# Patient Record
Sex: Female | Born: 1965 | Race: White | Hispanic: No | Marital: Married | Smoking: Never smoker
Health system: Southern US, Community
[De-identification: ages and names within clinical notes are randomized; demographics above are authoritative.]

## PROBLEM LIST (undated history)

## (undated) DIAGNOSIS — I10 Essential (primary) hypertension: Secondary | ICD-10-CM

## (undated) DIAGNOSIS — E7800 Pure hypercholesterolemia, unspecified: Secondary | ICD-10-CM

---

## 1995-01-29 HISTORY — PX: ECTOPIC PREGNANCY SURGERY: SHX613

## 2016-04-15 ENCOUNTER — Other Ambulatory Visit: Payer: Self-pay | Admitting: Family Medicine

## 2016-04-15 DIAGNOSIS — Z1231 Encounter for screening mammogram for malignant neoplasm of breast: Secondary | ICD-10-CM

## 2016-04-18 ENCOUNTER — Ambulatory Visit
Admission: RE | Admit: 2016-04-18 | Discharge: 2016-04-18 | Disposition: A | Discharge status: Home / Self Care | Payer: BC Managed Care – PPO | Source: Ambulatory Visit | Attending: Family Medicine | Admitting: Family Medicine

## 2016-04-18 DIAGNOSIS — Z1231 Encounter for screening mammogram for malignant neoplasm of breast: Secondary | ICD-10-CM

## 2017-01-17 ENCOUNTER — Other Ambulatory Visit: Payer: Self-pay | Admitting: Gastroenterology

## 2017-01-17 DIAGNOSIS — R112 Nausea with vomiting, unspecified: Secondary | ICD-10-CM

## 2017-01-23 ENCOUNTER — Ambulatory Visit
Admission: RE | Admit: 2017-01-23 | Discharge: 2017-01-23 | Disposition: A | Discharge status: Home / Self Care | Payer: BC Managed Care – PPO | Source: Ambulatory Visit | Attending: Gastroenterology | Admitting: Gastroenterology

## 2017-01-23 DIAGNOSIS — R112 Nausea with vomiting, unspecified: Secondary | ICD-10-CM

## 2017-01-24 ENCOUNTER — Other Ambulatory Visit (HOSPITAL_COMMUNITY): Payer: Self-pay | Admitting: Gastroenterology

## 2017-01-24 DIAGNOSIS — R112 Nausea with vomiting, unspecified: Secondary | ICD-10-CM

## 2017-01-29 ENCOUNTER — Ambulatory Visit (HOSPITAL_COMMUNITY)
Admission: RE | Admit: 2017-01-29 | Discharge: 2017-01-29 | Disposition: A | Discharge status: Home / Self Care | Payer: BC Managed Care – PPO | Source: Ambulatory Visit | Attending: Gastroenterology | Admitting: Gastroenterology

## 2017-02-03 ENCOUNTER — Other Ambulatory Visit: Payer: Self-pay | Admitting: Gastroenterology

## 2017-02-03 DIAGNOSIS — N281 Cyst of kidney, acquired: Secondary | ICD-10-CM

## 2017-02-06 ENCOUNTER — Encounter (HOSPITAL_COMMUNITY): Payer: Self-pay

## 2017-02-06 ENCOUNTER — Ambulatory Visit (HOSPITAL_COMMUNITY): Payer: BC Managed Care – PPO

## 2017-04-16 ENCOUNTER — Other Ambulatory Visit: Payer: Self-pay | Admitting: Family Medicine

## 2017-04-16 DIAGNOSIS — Z1231 Encounter for screening mammogram for malignant neoplasm of breast: Secondary | ICD-10-CM

## 2017-05-05 ENCOUNTER — Ambulatory Visit: Payer: BC Managed Care – PPO

## 2017-05-29 ENCOUNTER — Ambulatory Visit
Admission: RE | Admit: 2017-05-29 | Discharge: 2017-05-29 | Disposition: A | Discharge status: Home / Self Care | Payer: BC Managed Care – PPO | Source: Ambulatory Visit | Attending: Family Medicine | Admitting: Family Medicine

## 2017-05-29 DIAGNOSIS — Z1231 Encounter for screening mammogram for malignant neoplasm of breast: Secondary | ICD-10-CM

## 2018-06-10 ENCOUNTER — Encounter: Payer: Self-pay | Admitting: Internal Medicine

## 2018-06-10 ENCOUNTER — Other Ambulatory Visit: Payer: Self-pay

## 2018-06-10 ENCOUNTER — Ambulatory Visit: Payer: BC Managed Care – PPO | Admitting: Internal Medicine

## 2018-06-10 ENCOUNTER — Ambulatory Visit (INDEPENDENT_AMBULATORY_CARE_PROVIDER_SITE_OTHER): Payer: BC Managed Care – PPO

## 2018-06-10 DIAGNOSIS — J45991 Cough variant asthma: Secondary | ICD-10-CM

## 2018-06-10 MED ORDER — BUDESONIDE-FORMOTEROL FUMARATE 80-4.5 MCG/ACT IN AERO: 2.0000 | INHALATION_SPRAY | Freq: Two times a day (BID) | RESPIRATORY_TRACT | 0 refills | Status: AC

## 2018-06-10 NOTE — Assessment & Plan Note (Addendum)
Onset in Middle school  -  06/10/2018  After extensive coaching inhaler device,  effectiveness =    75%  rec symb 80 2bid prn   Hx is most c/w intermittent, not chronic cough variant asthma and best rx is probably "prn" symbicort 80 Based on two studies from NEJM  378; 20 p 1865 (2018) and 380 : p2020-30 (2019) in pts with mild asthma it is reasonable to use low dose symbicort eg 80 2bid "prn" flare in this setting but I emphasized this was only shown with symbicort and takes advantage of the rapid onset of action but is not the same as "rescue therapy" but can be stopped once the acute symptoms have resolved and the need for rescue has been minimized (< 2 x weekly)  . Advises that if she does take symbicort 80 prn I strongly advise she take the 2 bid dose to start with for a week to feel the full benefit of both the LABA ( 5 min onset) and the ICS (one week benefit).   Advised: If your breathing worsens or you need to use your rescue inhaler more than twice weekly or wake up more than twice a month with any respiratory symptoms or require more than two rescue inhalers per year, we need to see you right away because this means we're not controlling the underlying problem (inflammation) adequately.  Rescue inhalers (albuterol) do not control inflammation and overuse can lead to unnecessary and costly consequences.  They can make you feel better temporarily but eventually they will quit working effectively much as sleep aids lead to more insomnia if used regularly.     F/u in 6 weeks.    Total time devoted to counseling  > 50 % of initial 60 min office visit:  review case with pt/   device teaching which extended face to face time for this visit/  discussion of options/alternatives/ personally creating written customized instructions  in presence of pt  then going over those specific  Instructions directly with the pt including how to use all of the meds but in particular covering each new medication in  detail and the difference between the maintenance= "automatic" meds and the prns using an action plan format for the latter (If this problem/symptom => do that organization reading Left to right).  Please see AVS from this visit for a full list of these instructions which I personally wrote for this pt and  are unique to this visit.

## 2018-06-10 NOTE — Patient Instructions (Addendum)
Plan A = Automatic = Symbicort 80 Take up to 2 puffs first thing in am and then another 2 puffs about 12 hours later.   Work on inhaler technique:  relax and gently blow all the way out then take a nice smooth deep breath back in, triggering the inhaler at same time you start breathing in.  Hold for up to 5 seconds if you can. Blow out thru nose. Rinse and gargle with water when done    Plan B = Backup Only use your albuterol inhaler as a rescue medication to be used if you can't catch your breath by resting or doing a relaxed purse lip breathing pattern.  - The less you use it, the better it will work when you need it. - Ok to use the inhaler up to 2 puffs  every 4 hours if you must but call for appointment if use goes up over your usual need - Don't leave home without it !!  (think of it like the spare tire for your car)   Please remember to go to the  x-ray department  for your tests - we will call you with the results when they are available    Please schedule a follow up office visit in 6 weeks, call sooner if needed with all medications /inhalers/ solutions in hand so we can verify exactly what you are taking. This includes all medications from all doctors and over the counters

## 2018-06-10 NOTE — Progress Notes (Signed)
Cheryl Schwartz, female    DOB: Jun 10, 1965     MRN: [id number]   Brief patient profile:  7 yow never smoker with recurrent cough pattern in middle school while living in [facility] [place]   Then lived in [place] about the same pattern then moved to [place] and did fine s any meds until late March 2020 with onset cough and chest tightness > UC neb rx helped only the wheeze but not the chest tightness or the coughing and then placed on symbcort "no  better " > then took prednisone and totally eliminated all the symptoms so referred to pulmonary clinic 06/10/2018 by Jarrett Soho       History of Present Illness  06/10/2018  Pulmonary/ 1st office eval/Cheryl Schwartz last prednisone one day prior / no longer taking symbicort  Chief Complaint  Patient presents with  . Pulmonary Consult    Referred by Jarrett Soho, PA. Pt states she had been having chest tightness and cough- resolved since took prednisone recently.   Dyspnea:  Walking dog / uphillls ok but real aerobics  Cough: gone / was dry and daytime not typically noct  Sleep: fine flat/ one pillows  SABA use: not needing   No obvious day to day or daytime variability or assoc excess/ purulent sputum or mucus plugs or hemoptysis or cp or chest tightness, subjective wheeze or overt sinus or hb symptoms.   Sleeping  without nocturnal  or early am exacerbation  of respiratory  c/o's or need for noct saba. Also denies any obvious fluctuation of symptoms with weather or environmental changes or other aggravating or alleviating factors except as outlined above   No unusual exposure hx or h/o childhood pna  or knowledge of premature birth.  Current Allergies, Complete Past Medical History, Past Surgical History, Family History, and Social History were reviewed in [employer] record.  ROS  The following are not active complaints unless bolded Hoarseness, sore throat, dysphagia, dental problems, itching, sneezing,  nasal  congestion or discharge of excess mucus or purulent secretions, ear ache,   fever, chills, sweats, unintended wt loss or wt gain, classically pleuritic or exertional cp,  orthopnea pnd or arm/hand swelling  or leg swelling, presyncope, palpitations, abdominal pain, anorexia, nausea, vomiting, diarrhea  or change in bowel habits or change in bladder habits, change in stools or change in urine, dysuria, hematuria,  rash, arthralgias, visual complaints, headache, numbness, weakness or ataxia or problems with walking or coordination,  change in mood or  memory.           No past medical history on file.  Outpatient Medications Prior to Visit  Medication Sig Dispense Refill  . chlorthalidone (HYGROTON) 25 MG tablet Take 1 tablet by mouth daily.    . pravastatin (PRAVACHOL) 40 MG tablet Take 40 mg by mouth daily.    . verapamil (CALAN-SR) 240 MG CR tablet Take 1 tablet by mouth daily.    Marland Kitchen albuterol (VENTOLIN HFA) 108 (90 Base) MCG/ACT inhaler Inhale 2 puffs into the lungs every 4 (four) hours as needed.    Marland Kitchen buPROPion (WELLBUTRIN XL) 150 MG 24 hr tablet Take 1 tablet by mouth daily.        Objective:     BP 136/90 (BP Location: Left Arm, Cuff Size: Normal)   Pulse 70   Temp 98.7 F (37.1 C) (Oral)   Ht  (1.778 m)   Wt 218[name] kg)   SpO2 98%   BMI 31.28  kg/m   SpO2: 98 % RA  Pleasant amb wf nad  HEENT: nl dentition, turbinates bilaterally, and oropharynx. Nl external ear canals without cough reflex   NECK :  without JVD/Nodes/TM/ nl carotid upstrokes bilaterally   LUNGS: no acc muscle use,  Nl contour chest which is clear to A and P bilaterally without cough on insp or exp maneuvers   CV:  RRR  no s3 or murmur or increase in P2, and no edema   ABD:  soft and nontender with nl inspiratory excursion in the supine position. No bruits or organomegaly appreciated, bowel sounds nl  MS:  Nl gait/ ext warm without deformities, calf tenderness, cyanosis or clubbing No  obvious joint restrictions   SKIN: warm and dry without lesions    NEURO:  alert, approp, nl sensorium with  no motor or cerebellar deficits apparent.    CXR PA and Lateral:   06/10/2018 :    I personally reviewed images and agree with radiology impression as follows:    Negative for acute cardiopulmonary disease      Assessment   Cough variant asthma Onset in Middle school  -  06/10/2018  After extensive coaching inhaler device,  effectiveness =    75%  rec symb 80 2bid prn   Hx is most c/w intermittent, not chronic cough variant asthma and best rx is probably "prn" symbicort 80 Based on two studies from NEJM  378; 20 p 1865 (2018) and 380 : p2020-30 (2019) in pts with mild asthma it is reasonable to use low dose symbicort eg 80 2bid "prn" flare in this setting but I emphasized this was only shown with symbicort and takes advantage of the rapid onset of action but is not the same as "rescue therapy" but can be stopped once the acute symptoms have resolved and the need for rescue has been minimized (< 2 x weekly)  . Advises that if she does take symbicort 80 prn I strongly advise she take the 2 bid dose to start with for a week to feel the full benefit of both the LABA ( 5 min onset) and the ICS (one week benefit).   Advised: If your breathing worsens or you need to use your rescue inhaler more than twice weekly or wake up more than twice a month with any respiratory symptoms or require more than two rescue inhalers per year, we need to see you right away because this means we're not controlling the underlying problem (inflammation) adequately.  Rescue inhalers (albuterol) do not control inflammation and overuse can lead to unnecessary and costly consequences.  They can make you feel better temporarily but eventually they will quit working effectively much as sleep aids lead to more insomnia if used regularly.     F/u in 6 weeks.      Total time devoted to counseling  > 50 % of initial  60 min office visit:  review case with pt/   device teaching which extended face to face time for this visit/  discussion of options/alternatives/ personally creating written customized instructions  in presence of pt  then going over those specific  Instructions directly with the pt including how to use all of the meds but in particular covering each new medication in detail and the difference between the maintenance= "automatic" meds and the prns using an action plan format for the latter (If this problem/symptom => do that organization reading Left to right).  Please see AVS from this visit for a  full list of these instructions which I personally wrote for this pt and  are unique to this visit.    Sandrea HughsMichael Tyshawn Keel, MD 06/10/2018

## 2018-06-11 NOTE — Progress Notes (Signed)
Spoke with pt and notified of results per Dr. Wert. Pt verbalized understanding and denied any questions. 

## 2018-07-02 ENCOUNTER — Other Ambulatory Visit: Payer: Self-pay | Admitting: Family Medicine

## 2018-07-02 DIAGNOSIS — Z9289 Personal history of other medical treatment: Secondary | ICD-10-CM

## 2018-07-22 ENCOUNTER — Ambulatory Visit: Payer: BC Managed Care – PPO | Admitting: Internal Medicine

## 2018-08-07 ENCOUNTER — Encounter: Payer: Self-pay | Admitting: Internal Medicine

## 2018-08-07 ENCOUNTER — Other Ambulatory Visit: Payer: Self-pay

## 2018-08-07 ENCOUNTER — Ambulatory Visit: Payer: BC Managed Care – PPO | Admitting: Internal Medicine

## 2018-08-07 DIAGNOSIS — J45991 Cough variant asthma: Secondary | ICD-10-CM | POA: Diagnosis not present

## 2018-08-07 NOTE — Patient Instructions (Signed)
Symbicort 80 up to Take 2 puffs first thing in am and then another 2 puffs about 12 hours later.   If any respiratory flares occur, immediately start the high dose for a week then wean down or off   Please schedule a follow up visit in 12  months but call sooner if needed

## 2018-08-07 NOTE — Progress Notes (Signed)
Cheryl Schwartz, female    DOB: 11-20-1965     MRN: [id number]   Brief patient profile:  43 yow never smoker with recurrent cough pattern in middle school while living in [place] lived in [place] about the same pattern then moved to [place] and did fine s any meds until late March 2020 with onset cough and chest tightness > UC neb rx helped only the wheeze but not the chest tightness or the coughing and then placed on symbcort "no  better " > then took prednisone and totally eliminated all the symptoms so referred to pulmonary clinic 06/10/2018 by Marda Stalker       History of Present Illness  06/10/2018  Pulmonary/ 1st office eval/Wert last prednisone one day prior / no longer taking symbicort  Chief Complaint  Patient presents with  . Pulmonary Consult    Referred by Marda Stalker, PA. Pt states she had been having chest tightness and cough- resolved since took prednisone recently.   Dyspnea:  Walking dog / uphillls ok but real aerobics  Cough: gone / was dry and daytime not typically noct  Sleep: fine flat/ one pillows  SABA use: not needing  rec Plan A = Automatic = Symbicort 80 Take up to 2 puffs first thing in am and then another 2 puffs about 12 hours later.  Work on inhaler technique:  Plan B = Backup Only use your albuterol inhaler as a rescue medication    08/07/2018  f/u ov/Wert re: cough variant asthma, intermittent, no symbicort for a week, some need for saba Chief Complaint  Patient presents with  . Follow-up    Breathing is overall doing well. She had some cough last night. She rarely uses the albuterol.   Dyspnea:  Walking dog uphill in heat is about the only thing causes any sob  Cough: no Sleeping: fine able to lie flat  SABA use: minimal  02: no    No obvious day to day or daytime variability or assoc excess/ purulent sputum or mucus plugs or hemoptysis or cp or chest tightness, subjective wheeze or overt sinus or hb symptoms.    Sleeping  without nocturnal  or early am exacerbation  of respiratory  c/o's or need for noct saba. Also denies any obvious fluctuation of symptoms with weather or environmental changes or other aggravating or alleviating factors except as outlined above   No unusual exposure hx or h/o childhood pna/ asthma or knowledge of premature birth.  Current Allergies, Complete Past Medical History, Past Surgical History, Family History, and Social History were reviewed in [employer] record.  ROS  The following are not active complaints unless bolded Hoarseness, sore throat, dysphagia, dental problems, itching, sneezing,  nasal congestion or discharge of excess mucus or purulent secretions, ear ache,   fever, chills, sweats, unintended wt loss or wt gain, classically pleuritic or exertional cp,  orthopnea pnd or arm/hand swelling  or leg swelling, presyncope, palpitations, abdominal pain, anorexia, nausea, vomiting, diarrhea  or change in bowel habits or change in bladder habits, change in stools or change in urine, dysuria, hematuria,  rash, arthralgias, visual complaints, headache, numbness, weakness or ataxia or problems with walking or coordination,  change in mood or  memory.        Current Meds  Medication Sig  . albuterol (VENTOLIN HFA) 108 (90 Base) MCG/ACT inhaler Inhale 2 puffs into the lungs every 4 (four) hours as needed.  . budesonide-formoterol (SYMBICORT) 80-4.5  MCG/ACT inhaler Inhale 2 puffs into the lungs 2 (two) times a day.  Marland Kitchen. buPROPion (WELLBUTRIN XL) 150 MG 24 hr tablet Take 1 tablet by mouth daily.  . chlorthalidone (HYGROTON) 25 MG tablet Take 1 tablet by mouth daily.  . pravastatin (PRAVACHOL) 40 MG tablet Take 40 mg by mouth daily.  . verapamil (CALAN-SR) 240 MG CR tablet Take 1 tablet by mouth daily.           Objective:     amb pleasant wf   Wt Readings from Last 3 Encounters:  08/07/18 219 lb (99.3 kg)  06/10/18 218 lb (98.9 kg)     Vital  signs reviewed - Note on arrival 02 sats  95% on RA    HEENT: nl dentition, turbinates bilaterally, and oropharynx. Nl external ear canals without cough reflex   NECK :  without JVD/Nodes/TM/ nl carotid upstrokes bilaterally   LUNGS: no acc muscle use,  Nl contour chest which is clear to A and P bilaterally without cough on insp or exp maneuvers   CV:  RRR  no s3 or murmur or increase in P2, and no edema   ABD:  soft and nontender with nl inspiratory excursion in the supine position. No bruits or organomegaly appreciated, bowel sounds nl  MS:  Nl gait/ ext warm without deformities, calf tenderness, cyanosis or clubbing No obvious joint restrictions   SKIN: warm and dry without lesions    NEURO:  alert, approp, nl sensorium with  no motor or cerebellar deficits apparent.           Assessment

## 2018-08-08 ENCOUNTER — Encounter: Payer: Self-pay | Admitting: Internal Medicine

## 2018-08-08 NOTE — Assessment & Plan Note (Signed)
Onset in Middle school  -  06/10/2018   rec symb 80 2bid prn - 08/07/2018  After extensive coaching inhaler device,  effectiveness =    95% > continue symbicort 80 bid "prn"    All goals of chronic asthma control met including optimal function and elimination of symptoms with minimal need for rescue therapy.  Contingencies discussed in full including contacting this office immediately if not controlling the symptoms using the rule of two's.     Continue prn symb 80  Based on two studies from [place]; 20 p 1865 (2018) and 380 : p2020-30 (2019) in pts with mild asthma it is reasonable to use low dose symbicort eg 80 2bid "prn" flare in this setting but I emphasized this was only shown with symbicort and takes advantage of the rapid onset of action but is not the same as "rescue therapy" but can be stopped once the acute symptoms have resolved and the need for rescue has been minimized (< 2 x weekly)

## 2018-08-20 ENCOUNTER — Other Ambulatory Visit: Payer: Self-pay

## 2018-08-20 ENCOUNTER — Ambulatory Visit
Admission: RE | Admit: 2018-08-20 | Discharge: 2018-08-20 | Disposition: A | Discharge status: Home / Self Care | Payer: BC Managed Care – PPO | Source: Ambulatory Visit | Attending: Family Medicine | Admitting: Family Medicine

## 2018-08-20 DIAGNOSIS — Z9289 Personal history of other medical treatment: Secondary | ICD-10-CM

## 2018-12-01 ENCOUNTER — Other Ambulatory Visit: Payer: Self-pay | Admitting: Family Medicine

## 2018-12-01 ENCOUNTER — Other Ambulatory Visit (HOSPITAL_COMMUNITY)
Admission: RE | Admit: 2018-12-01 | Discharge: 2018-12-01 | Disposition: A | Discharge status: Home / Self Care | Payer: BC Managed Care – PPO | Source: Ambulatory Visit | Attending: Family Medicine | Admitting: Family Medicine

## 2018-12-01 DIAGNOSIS — Z124 Encounter for screening for malignant neoplasm of cervix: Secondary | ICD-10-CM | POA: Diagnosis not present

## 2018-12-04 LAB — CYTOLOGY - PAP
Adequacy: ABSENT
Comment: NEGATIVE
Diagnosis: NEGATIVE
High risk HPV: NEGATIVE

## 2019-08-09 ENCOUNTER — Ambulatory Visit: Payer: BC Managed Care – PPO | Admitting: Internal Medicine

## 2019-09-28 ENCOUNTER — Other Ambulatory Visit: Payer: Self-pay | Admitting: Family Medicine

## 2019-09-28 DIAGNOSIS — Z1231 Encounter for screening mammogram for malignant neoplasm of breast: Secondary | ICD-10-CM

## 2019-10-14 ENCOUNTER — Other Ambulatory Visit: Payer: Self-pay

## 2019-10-14 ENCOUNTER — Ambulatory Visit
Admission: RE | Admit: 2019-10-14 | Discharge: 2019-10-14 | Disposition: A | Discharge status: Home / Self Care | Payer: BC Managed Care – PPO | Source: Ambulatory Visit | Attending: Family Medicine | Admitting: Family Medicine

## 2019-10-14 DIAGNOSIS — Z1231 Encounter for screening mammogram for malignant neoplasm of breast: Secondary | ICD-10-CM

## 2019-10-18 ENCOUNTER — Other Ambulatory Visit: Payer: Self-pay | Admitting: Family Medicine

## 2019-10-18 DIAGNOSIS — R928 Other abnormal and inconclusive findings on diagnostic imaging of breast: Secondary | ICD-10-CM

## 2019-10-29 ENCOUNTER — Ambulatory Visit
Admission: RE | Admit: 2019-10-29 | Discharge: 2019-10-29 | Disposition: A | Discharge status: Home / Self Care | Payer: BC Managed Care – PPO | Source: Ambulatory Visit | Attending: Family Medicine | Admitting: Family Medicine

## 2019-10-29 ENCOUNTER — Other Ambulatory Visit: Payer: Self-pay

## 2019-10-29 DIAGNOSIS — R928 Other abnormal and inconclusive findings on diagnostic imaging of breast: Secondary | ICD-10-CM

## 2020-02-14 ENCOUNTER — Encounter (HOSPITAL_COMMUNITY): Payer: Self-pay | Admitting: *Deleted

## 2020-02-14 ENCOUNTER — Ambulatory Visit (HOSPITAL_COMMUNITY)
Admission: EM | Admit: 2020-02-14 | Discharge: 2020-02-14 | Disposition: A | Discharge status: Home / Self Care | Payer: BC Managed Care – PPO | Attending: Emergency Medicine | Admitting: Emergency Medicine

## 2020-02-14 ENCOUNTER — Emergency Department (HOSPITAL_COMMUNITY)
Admission: EM | Admit: 2020-02-14 | Discharge: 2020-02-14 | Disposition: A | Discharge status: Home / Self Care | Payer: BC Managed Care – PPO | Attending: Emergency Medicine | Admitting: Emergency Medicine

## 2020-02-14 ENCOUNTER — Emergency Department (HOSPITAL_COMMUNITY): Payer: BC Managed Care – PPO

## 2020-02-14 ENCOUNTER — Other Ambulatory Visit: Payer: Self-pay

## 2020-02-14 ENCOUNTER — Encounter (HOSPITAL_COMMUNITY): Payer: Self-pay

## 2020-02-14 DIAGNOSIS — J45991 Cough variant asthma: Secondary | ICD-10-CM | POA: Insufficient documentation

## 2020-02-14 DIAGNOSIS — I10 Essential (primary) hypertension: Secondary | ICD-10-CM | POA: Diagnosis not present

## 2020-02-14 DIAGNOSIS — R079 Chest pain, unspecified: Secondary | ICD-10-CM | POA: Diagnosis not present

## 2020-02-14 DIAGNOSIS — Z79899 Other long term (current) drug therapy: Secondary | ICD-10-CM | POA: Insufficient documentation

## 2020-02-14 HISTORY — DX: Pure hypercholesterolemia, unspecified: E78.00

## 2020-02-14 HISTORY — DX: Essential (primary) hypertension: I10

## 2020-02-14 LAB — CBC
HCT: 38.1 % (ref 36.0–46.0)
Hemoglobin: 12.2 g/dL (ref 12.0–15.0)
MCH: 28.9 pg (ref 26.0–34.0)
MCHC: 32 g/dL (ref 30.0–36.0)
MCV: 90.3 fL (ref 80.0–100.0)
Platelets: 236 10*3/uL (ref 150–400)
RBC: 4.22 MIL/uL (ref 3.87–5.11)
RDW: 12.4 % (ref 11.5–15.5)
WBC: 5.2 10*3/uL (ref 4.0–10.5)
nRBC: 0 % (ref 0.0–0.2)

## 2020-02-14 LAB — BASIC METABOLIC PANEL
Anion gap: 13 (ref 5–15)
BUN: 10 mg/dL (ref 6–20)
CO2: 25 mmol/L (ref 22–32)
Calcium: 9.8 mg/dL (ref 8.9–10.3)
Chloride: 101 mmol/L (ref 98–111)
Creatinine, Ser: 0.81 mg/dL (ref 0.44–1.00)
GFR, Estimated: >60 mL/min (ref >60)
Glucose, Bld: 93 mg/dL (ref 70–99)
Potassium: 3.8 mmol/L (ref 3.5–5.1)
Sodium: 139 mmol/L (ref 135–145)

## 2020-02-14 LAB — TROPONIN I (HIGH SENSITIVITY)
Troponin I (High Sensitivity): 3 ng/L (ref <18)
Troponin I (High Sensitivity): 3 ng/L (ref <18)

## 2020-02-14 LAB — D-DIMER, QUANTITATIVE: D-Dimer, Quant: <0.27 ug/mL-FEU (ref 0.00–0.50)

## 2020-02-14 MED ORDER — NITROGLYCERIN 0.4 MG SL SUBL
0.4000 mg | SUBLINGUAL_TABLET | Freq: Once | SUBLINGUAL | Status: AC
  Administered 2020-02-14: 0.4 mg via SUBLINGUAL
  Filled 2020-02-14: qty 1

## 2020-02-14 MED ORDER — ONDANSETRON HCL 4 MG/2ML IJ SOLN: 4.0000 mg | Freq: Once | INTRAMUSCULAR | Status: DC

## 2020-02-14 MED ORDER — HYDROCODONE-ACETAMINOPHEN 5-325 MG PO TABS
1.0000 | ORAL_TABLET | Freq: Once | ORAL | Status: AC
  Administered 2020-02-14: 1 via ORAL
  Filled 2020-02-14: qty 1

## 2020-02-14 MED ORDER — MORPHINE SULFATE (PF) 4 MG/ML IV SOLN: 4.0000 mg | Freq: Once | INTRAVENOUS | Status: DC

## 2020-02-14 NOTE — Discharge Instructions (Signed)
Your work up today was reassuring.   Please monitor and check your blood pressure. If you continue to have elevated BP readings, take this to your doctor and discuss you medications.   As we discussed, it is important that you follow up with cardiology. Call their office and arrange for an appointment.   Return to the Emergency Department immediately if you experiencing worsening chest pain, difficulty breathing, nausea/vomiting, get very sweaty, headache or any other worsening or concerning symptoms.

## 2020-02-14 NOTE — ED Notes (Signed)
Pt not yet in room.

## 2020-02-14 NOTE — ED Provider Notes (Signed)
HPI  SUBJECTIVE:  Cheryl Schwartz is a 55 y.o. female who presents with shortness of breath starting while on a walk yesterday.  States she had to stop and rest while going uphill.  She reports constant chest pain/pressure starting about an hour after her walk.  It is still present.  States that it goes "little bit" to her back.  She denies tearing sensation.  Does not radiate up her neck or down her arm.  She denies associated nausea, diaphoresis, fever, cough, wheeze, shortness of breath at rest.  No abdominal pain, syncope.  There is no exertional or positional component.  No recent viral illness.  No recent decongestant use, herbal supplement use.  She had heartburn symptoms 2 days ago, which is unusual for her, it has not returned.  She states this does not feel like her asthma.  She has never had symptoms like this before.  States that she measured her blood pressure at home and it was 170/92. she has a past medical history of hypertension and states that she is compliant with her medications.  She has a history of hypercholesterolemia, asthma.  No history of diabetes, smoking, MI, coronary artery disease, stroke, arrhythmia, PE, DVT.  Family history significant for father with MI at age 58.  WUJ:[id number], Toni Amend, PA-C   History reviewed. No pertinent past medical history.  History reviewed. No pertinent surgical history.  Family History  Problem Relation Age of Onset  . Emphysema Father        smoked    Social History   Tobacco Use  . Smoking status: Never Smoker  . Smokeless tobacco: Never Used  Vaping Use  . Vaping Use: Never used    No current facility-administered medications for this encounter.  Current Outpatient Medications:  .  albuterol (VENTOLIN HFA) 108 (90 Base) MCG/ACT inhaler, Inhale 2 puffs into the lungs every 4 (four) hours as needed., Disp: , Rfl:  .  budesonide-formoterol (SYMBICORT) 80-4.5 MCG/ACT inhaler, Inhale 2 puffs into the lungs 2 (two) times a day., Disp:  1 Inhaler, Rfl: 0 .  buPROPion (WELLBUTRIN XL) 150 MG 24 hr tablet, Take 1 tablet by mouth daily., Disp: , Rfl:  .  chlorthalidone (HYGROTON) 25 MG tablet, Take 1 tablet by mouth daily., Disp: , Rfl:  .  pravastatin (PRAVACHOL) 40 MG tablet, Take 40 mg by mouth daily., Disp: , Rfl:  .  verapamil (CALAN-SR) 240 MG CR tablet, Take 1 tablet by mouth daily., Disp: , Rfl:   No Known Allergies   ROS  As noted in HPI.   Physical Exam  BP (!) 170/100 (BP Location: Left Arm)   Pulse 74   Temp 98.5 F (36.9 C) (Oral)   Resp 17   SpO2 98%   Constitutional: Well developed, well nourished, no acute distress Eyes:  EOMI, conjunctiva normal bilaterally HENT: Normocephalic, atraumatic,mucus membranes moist Respiratory: Normal inspiratory effort, lungs clear bilaterally Cardiovascular: Normal rate, regular rhythm no murmurs rubs or gallops.  Radial pulses equal bilaterally. GI: nondistended skin: No rash, skin intact Musculoskeletal: Calves symmetric, nontender, no edema Neurologic: Alert & oriented x 3, no focal neuro deficits Psychiatric: Speech and behavior appropriate   ED Course   Medications - No data to display  Orders Placed This Encounter  Procedures  . ED EKG    Standing Status:   Standing    Number of Occurrences:   1    Order Specific Question:   Reason for Exam    Answer:   Chest Pain  No results found for this or any previous visit (from the past 24 hour(s)). No results found.  ED Clinical Impression  1. Chest pain, unspecified type   2. Accelerated hypertension      ED Assessment/Plan  EKG: Normal sinus rhythm, rate 76.  Normal axis, normal intervals.  No hypertrophy.  No ST-T wave changes.  No previous EKG for comparison.  Concern for hypertensive urgency with the chest pain and elevated blood pressure.  She has multiple cardiac risk factors.  dissection low in the differential her EKG is normal, she was symptomatic while the EKG was obtained.  She  otherwise appears stable, feel that she is stable to go to the [facility] emergency department via private vehicle.  Emphasize dimportance of going immediately to the ED.  Patient agrees with plan.  \  No orders of the defined types were placed in this encounter.   *This clinic note was created using Dragon dictation software. Therefore, there may be occasional mistakes despite careful proofreading.   ?    Domenick Gong, MD 02/14/20 1404

## 2020-02-14 NOTE — ED Triage Notes (Signed)
Chest pressure since after walking in the slow yesterday.  Seen at [facility] and sent here for further evaluation

## 2020-02-14 NOTE — ED Notes (Signed)
Reviewed discharge instructions with patient. Follow-up care reviewed. Patient verbalized understanding. Patient A&Ox4, VSS, and ambulatory with steady gait upon discharge.  

## 2020-02-14 NOTE — ED Triage Notes (Signed)
Pt presents with central chest pressure after taking a walking yesterday; pt states her blood pressure has also been elevated.

## 2020-02-14 NOTE — Discharge Instructions (Addendum)
I am concerned that this could be your heart.  Go immediately to the [facility] emergency department where they can rule out emergent changes of your symptoms.  For now if your pain changes, gets worse.

## 2020-02-14 NOTE — ED Notes (Signed)
Patient is being discharged from the Urgent Care and sent to the Emergency Department via POV with family member. Per Konrad Saha, MD, patient is in need of higher level of care due to chest pain and hypertension. Patient is aware and verbalizes understanding of plan of care.  Vitals:   02/14/20 1304  BP: (!) 170/100  Pulse: 74  Resp: 17  Temp: 98.5 F (36.9 C)  SpO2: 98%

## 2020-02-14 NOTE — ED Provider Notes (Signed)
[facility] EMERGENCY DEPARTMENT Provider Note   CSN: [id number] Arrival date & time: 02/14/20  1413     History Chief Complaint  Patient presents with  . Chest Pain    Cheryl Schwartz is a 55 y.o. female past ministry of high cholesterol, hypertension who presents for evaluation of chest pain that began yesterday.  Patient reports that approximately 5 PM last night, she took a walk in the snow.  She states that while walking, she felt mildly short of breath and states that she had a little bit of feeling like her heart was racing.  She reports that when she got home, she started experiencing a heaviness and a pressure across her anterior chest.  No associated diaphoresis, nausea/vomiting, shortness of breath.  She did not take any medications for the pain.  She states that the pain has been constant since then and it has not gotten better, worse.  She states it is not worth worse with exertion, positional movement.  She did notice today that while she was at urgent care when she took a deep breath in, that made it hurt more.  She did not have any strenuous activity prior to the chest pain starting.  She has not taken any medications.  Currently she rates it a 7/10.  She has not been sick recently with fever, cough, chills.  She does not smoke.  No history of diabetes.  She reports no personal cardiac history.  She does states that her dad had a heart attack in his 32s and then had a quadruple bypass at age 69.  No other family history of cardiac abnormalities.   The history is provided by the patient.    HPI: A 55 year old patient with a history of hypertension, hypercholesterolemia and obesity presents for evaluation of chest pain. Initial onset of pain was more than 6 hours ago. The patient's chest pain is described as heaviness/pressure/tightness and is not worse with exertion. The patient's chest pain is not middle- or left-sided, is not well-localized, is not sharp and does  not radiate to the arms/jaw/neck. The patient does not complain of nausea and denies diaphoresis. The patient has a family history of coronary artery disease in a first-degree relative with onset less than age 41. The patient has no history of stroke, has no history of peripheral artery disease, has not smoked in the past 90 days and denies any history of treated diabetes.   Past Medical History:  Diagnosis Date  . High cholesterol   . Hypertension     Patient Active Problem List   Diagnosis Date Noted  . Cough variant asthma 06/10/2018    Past Surgical History:  Procedure Laterality Date  . CESAREAN SECTION    . ECTOPIC PREGNANCY SURGERY  1997     OB History   No obstetric history on file.     Family History  Problem Relation Age of Onset  . Emphysema Father        smoked    Social History   Tobacco Use  . Smoking status: Never Smoker  . Smokeless tobacco: Never Used  Vaping Use  . Vaping Use: Never used  Substance Use Topics  . Alcohol use: Never  . Drug use: Never    Home Medications Prior to Admission medications   Medication Sig Start Date End Date Taking? Authorizing Provider  albuterol (VENTOLIN HFA) 108 (90 Base) MCG/ACT inhaler Inhale 2 puffs into the lungs every 4 (four) hours as needed for shortness  of breath or wheezing. 05/09/18  Yes [provider]  budesonide-formoterol (SYMBICORT) 80-4.5 MCG/ACT inhaler Inhale 2 puffs into the lungs 2 (two) times a day. Patient taking differently: Inhale 2 puffs into the lungs 2 (two) times daily as needed (for flares). 06/10/18  Yes Nyoka Cowden, MD  buPROPion [facility] SR) 100 MG 12 hr tablet Take 100 mg by mouth in the morning.   Yes [provider]  dicyclomine (BENTYL) 20 MG tablet Take 20 mg by mouth 3 (three) times daily as needed for spasms.   Yes [provider]  ondansetron (ZOFRAN) 4 MG tablet Take 4 mg by mouth every 8 (eight) hours as needed for nausea or vomiting.   Yes  [provider]  pravastatin (PRAVACHOL) 80 MG tablet Take 80 mg by mouth daily. 12/07/19  Yes [provider]  verapamil (CALAN-SR) 240 MG CR tablet Take 120 mg by mouth in the morning and at bedtime. 05/18/18  Yes [provider]  chlorthalidone (HYGROTON) 25 MG tablet Take 1 tablet by mouth daily. 06/10/18   [provider]    Allergies    Zoloft [sertraline] and Tape  Review of Systems   Review of Systems  Constitutional: Negative for fever.  Respiratory: Positive for shortness of breath. Negative for cough.   Cardiovascular: Positive for chest pain. Negative for leg swelling.  Gastrointestinal: Negative for abdominal pain, nausea and vomiting.  Genitourinary: Negative for dysuria and hematuria.  Neurological: Negative for headaches.  All other systems reviewed and are negative.   Physical Exam Updated Vital Signs BP (!) 149/92   Pulse 83   Temp 98.4 F (36.9 C) (Oral)   Resp (!) 22   Ht 5\' 8"  (1.727 m)   Wt 98.4 kg   SpO2 96%   BMI 32.99 kg/m   Physical Exam Vitals and nursing note reviewed.  Constitutional:      Appearance: Normal appearance. She is well-developed and well-nourished.  HENT:     Head: Normocephalic and atraumatic.     Mouth/Throat:     Mouth: Oropharynx is clear and moist and mucous membranes are normal.  Eyes:     General: Lids are normal.     Extraocular Movements: EOM normal.     Conjunctiva/sclera: Conjunctivae normal.     Pupils: Pupils are equal, round, and reactive to light.  Cardiovascular:     Rate and Rhythm: Normal rate and regular rhythm.     Pulses: Normal pulses.          Radial pulses are 2+ on the right side and 2+ on the left side.       Dorsalis pedis pulses are 2+ on the right side and 2+ on the left side.     Heart sounds: Normal heart sounds. No murmur heard. No friction rub. No gallop.   Pulmonary:     Effort: Pulmonary effort is normal.     Breath sounds: Normal breath sounds.      Comments: Lungs clear to auscultation bilaterally.  Symmetric chest rise.  No wheezing, rales, rhonchi. Chest:     Comments: Pain slightly reproduced with palpation of anterior chest wall Abdominal:     Palpations: Abdomen is soft. Abdomen is not rigid.     Tenderness: There is no abdominal tenderness. There is no guarding.  Musculoskeletal:        General: Normal range of motion.     Cervical back: Full passive range of motion without pain.     Comments: BLE  are symmetric in appearance without any overlying warmth, erythema.   Skin:    General: Skin is warm and dry.     Capillary Refill: Capillary refill takes less than 2 seconds.  Neurological:     Mental Status: She is alert and oriented to person, place, and time.  Psychiatric:        Mood and Affect: Mood and affect normal.        Speech: Speech normal.     ED Results / Procedures / Treatments   Labs (all labs ordered are listed, but only abnormal results are displayed) Labs Reviewed  BASIC METABOLIC PANEL  CBC  D-DIMER, QUANTITATIVE (NOT AT [facility])  I-STAT BETA HCG BLOOD, ED ([facility], [facility], [facility] ONLY)  TROPONIN I (HIGH SENSITIVITY)  TROPONIN I (HIGH SENSITIVITY)    EKG EKG Interpretation  Date/Time:  Monday February 14 2020 14:56:38 EST Ventricular Rate:  84 PR Interval:  138 QRS Duration: 84 QT Interval:  378 QTC Calculation: 446 R Axis:   18 Text Interpretation: Normal sinus rhythm Normal ECG Confirmed by Tilden Fossaees, Elizabeth [phone]([id number]) on 02/14/2020 6:09:08 PM   Radiology DG Chest 2 View  Result Date: 02/14/2020 CLINICAL DATA:  Chest pain and pressure x1 day. EXAM: CHEST - 2 VIEW COMPARISON:  Jun 10, 2018 FINDINGS: The heart size and mediastinal contours are within normal limits. Both lungs are clear. The visualized skeletal structures are unremarkable. IMPRESSION: No active cardiopulmonary disease. Electronically Signed   By: Aram Candelahaddeus  Houston M.D.   On: 02/14/2020 19:40    Procedures Procedures (including critical care  time)  Medications Ordered in ED Medications  nitroGLYCERIN (NITROSTAT) SL tablet 0.4 mg (0.4 mg Sublingual Given 02/14/20 2025)  HYDROcodone-acetaminophen (NORCO/VICODIN) 5-325 MG per tablet 1 tablet (1 tablet Oral Given 02/14/20 2222)    ED Course  I have reviewed the triage vital signs and the nursing notes.  Pertinent labs & imaging results that were available during my care of the patient were reviewed by me and considered in my medical decision making (see chart for details).    MDM Rules/Calculators/A&P HEAR Score: 3                        55 y.o. F past medical history of hypertension, high cholesterol who presents for evaluation of chest pain that began last night.  She reports that after walking in the snow, she went home and states that she started having pain.  Described as a pressure across her anterior chest.  Not associate with nausea, vomiting, diaphoresis.  Pain is not worse with exertion.  She does report that while at urgent care, it was slightly worse with deep inspiration.  No PE risk factors but does report pleuritic pain.  She does have family history of cardiac history.  On initial arrival, she is afebrile, nontoxic-appearing.  Vitals stable. She is hypertensive. She does have a history of hypertension. History/physical exam is not concerning for hypertensive emergency.  Consider cardiac etiology but she does have atypical features and her pain is not worse with exertion. History/physical exam is not concerning for pericarditis, aortic dissection.   Initial troponin negative.  CBC shows no leukocytosis or anemia.  Hemoglobin stable.  BMP is unremarkable.  Chest x-ray negative for any acute abnormalities.  Given her history/physical and risk factors, she has a heart score of 3. Will plan for delta troponin.   Her second troponin is negative. At this point, she has had chest pain for 24+ hours  with two negative troponins and reassuring EKG. At this time, given her heart  score, reassuring work up, feel that patient is appropriate for outpatient follow up. I do feel that patient needs outpatient cardiology follow up given her family history. Will place ambulatory referral to cards.   Updated patient on plan. She is agreeable. Instructed patient to monitor her BP and follow up with her PCP.  She is hemodynamically stable at this time. Patient referred to outpatient cardiology and instructed to follow up. At this time, patient exhibits no emergent life-threatening condition that require further evaluation in ED. Discussed patient with Dr. Madilyn Hook who is agreeable to plan. Patient had ample opportunity for questions and discussion. All patient's questions were answered with full understanding. Strict return precautions discussed. Patient expresses understanding and agreement to plan.   Portions of this note were generated with Scientist, clinical (histocompatibility and immunogenetics). Dictation errors may occur despite best attempts at proofreading.   Final Clinical Impression(s) / ED Diagnoses Final diagnoses:  Nonspecific chest pain  Hypertension, unspecified type    Rx / DC Orders ED Discharge Orders         Ordered    Ambulatory referral to Cardiology        02/14/20 2212           Rosana Hoes 02/14/20 2224    Tilden Fossa, MD 02/15/20 1330

## 2020-02-27 NOTE — Progress Notes (Signed)
Cardiology Office Note:    Date:  02/28/2020   ID:  Cheryl Schwartz, DOB 08-26-1965, MRN [id number]  PCP:  Marda Stalker, PA-C  Cardiologist:  No primary care provider on file.  Electrophysiologist:  None   Referring MD: Volanda Napoleon, PA-C   Chief Complaint  Patient presents with  . Chest Pain    History of Present Illness:    Cheryl Schwartz is a 55 y.o. female with a hx of hypertension, hyperlipidemia who presents as an ED follow-up for chest pain. She was seen in the ED for chest pain on 02/14/20. EKG unremarkable and negative troponins x2.  She reports that she went for a walk the day prior to her ED visit.  She was walking through the snow and felt like her heart was racing.  About an hour after this had onset of chest pain.  Describes as pressure in the center of her chest, felt like someone was sitting on her chest.  Chest pain lasted into the next day, about 35 hours.  Went to the ED and work-up was unremarkable as above.  Reports she walks her dog every day for 20-25 minutes.  Denies any chest pain with this.  Denies any dyspnea, lightheadedness, syncope, or palpitations.  Does report she gets occasional lower extremity edema.  She was started on chlorthalidone last week for elevated blood pressure.  No smoking history.  Family history includes father had MI at age 74 and then another MI at 67 and underwent CABG x4.  Past Medical History:  Diagnosis Date  . High cholesterol   . Hypertension     Past Surgical History:  Procedure Laterality Date  . CESAREAN SECTION    . ECTOPIC PREGNANCY SURGERY  1997    Current Medications: Current Meds  Medication Sig  . albuterol (VENTOLIN HFA) 108 (90 Base) MCG/ACT inhaler Inhale 2 puffs into the lungs every 4 (four) hours as needed for shortness of breath or wheezing.  . budesonide-formoterol (SYMBICORT) 80-4.5 MCG/ACT inhaler Inhale 2 puffs into the lungs 2 (two) times a day. (Patient taking differently: Inhale 2 puffs into the lungs  2 (two) times daily as needed (for flares).)  . buPROPion (WELLBUTRIN SR) 100 MG 12 hr tablet Take 100 mg by mouth in the morning.  . chlorthalidone (HYGROTON) 25 MG tablet Take 1 tablet by mouth daily.  . metoprolol tartrate (LOPRESSOR) 50 MG tablet Take 50 mg (1 tablet) TWO HOURS prior to CT scan  . ondansetron (ZOFRAN) 4 MG tablet Take 4 mg by mouth every 8 (eight) hours as needed for nausea or vomiting.  . pravastatin (PRAVACHOL) 80 MG tablet Take 80 mg by mouth daily.  . verapamil (CALAN-SR) 240 MG CR tablet Take 120 mg by mouth in the morning and at bedtime.     Allergies:   Zoloft [sertraline] and Tape   Social History   Socioeconomic History  . Marital status: Married    Spouse name: Not on file  . Number of children: Not on file  . Years of education: Not on file  . Highest education level: Not on file  Occupational History  . Not on file  Tobacco Use  . Smoking status: Never Smoker  . Smokeless tobacco: Never Used  Vaping Use  . Vaping Use: Never used  Substance and Sexual Activity  . Alcohol use: Never  . Drug use: Never  . Sexual activity: Not on file  Other Topics Concern  . Not on file  Social History Narrative  .  Not on file   Social Determinants of Health   Financial Resource Strain: Not on file  Food Insecurity: Not on file  Transportation Needs: Not on file  Physical Activity: Not on file  Stress: Not on file  Social Connections: Not on file     Family History: The patient's family history includes Emphysema in her father.  ROS:   Please see the history of present illness.     All other systems reviewed and are negative.  EKGs/Labs/Other Studies Reviewed:    The following studies were reviewed today:   EKG:  EKG is ordered today.  The ekg ordered today demonstrates normal sinus rhythm, rate 68, no ST/T abnormalities  Recent Labs: 02/14/2020: BUN 10; Creatinine, Ser 0.81; Hemoglobin 12.2; Platelets 236; Potassium 3.8; Sodium 139  Recent  Lipid Panel No results found for: CHOL, TRIG, HDL, CHOLHDL, VLDL, LDLCALC, LDLDIRECT  Physical Exam:    VS:  BP 138/82   Pulse 68   Ht '5\' 9"'  (1.753 m)   Wt 223 lb 3.2 oz (101.2 kg)   BMI 32.96 kg/m     Wt Readings from Last 3 Encounters:  02/28/20 223 lb 3.2 oz (101.2 kg)  02/14/20 217 lb (98.4 kg)  08/07/18 219 lb (99.3 kg)     HMC:NOBS nourished, well developed in no acute distress HEENT: Normal NECK: No JVD; No carotid bruits LYMPHATICS: No lymphadenopathy CARDIAC: RRR, no murmurs, rubs, gallops RESPIRATORY:  Clear to auscultation without rales, wheezing or rhonchi  ABDOMEN: Soft, non-tender, non-distended MUSCULOSKELETAL:  No edema; No deformity  SKIN: Warm and dry NEUROLOGIC:  Alert and oriented x 3 PSYCHIATRIC:  Normal affect   ASSESSMENT:    1. Chest pain of uncertain etiology   2. Essential hypertension   3. Hyperlipidemia, unspecified hyperlipidemia type    PLAN:    Chest pain: Atypical in description but does have CAD risk factors (age, hypertension, hyperlipidemia, family history).  Overall would classify as intermediate risk of obstructive CAD and further evaluation is warranted -Coronary CTA.  Will give 50 mg of metoprolol prior to study -Echocardiogram  Hypertension: On chlorthalidone 25 mg daily, verapamil 120 mg twice daily.  BP mildly elevated but was started on chlorthalidone last week.  Asked to check BP daily for next 2 weeks and call with results  Hyperlipidemia: On pravastatin 80 mg.  LDL 122 on 02/23/2020.  Will follow up results of coronary CT to guide how aggressive to be in lowering cholesterol  RTC in 3 months  Medication Adjustments/Labs and Tests Ordered: Current medicines are reviewed at length with the patient today.  Concerns regarding medicines are outlined above.  Orders Placed This Encounter  Procedures  . CT CORONARY MORPH W/CTA COR W/SCORE W/CA W/CM &/OR WO/CM  . CT CORONARY FRACTIONAL FLOW RESERVE DATA PREP  . CT CORONARY  FRACTIONAL FLOW RESERVE FLUID ANALYSIS  . EKG 12-Lead  . ECHOCARDIOGRAM COMPLETE   Meds ordered this encounter  Medications  . metoprolol tartrate (LOPRESSOR) 50 MG tablet    Sig: Take 50 mg (1 tablet) TWO HOURS prior to CT scan    Dispense:  1 tablet    Refill:  0    Patient Instructions  Medication Instructions:  Your physician recommends that you continue on your current medications as directed. Please refer to the Current Medication list given to you today.  ---Morning of CT scan-take metoprolol tartrate (Lopressor) 50 mg TWO hours prior  Testing/Procedures: Your physician has requested that you have an echocardiogram. Echocardiography is a painless  test that uses sound waves to create images of your heart. It provides your doctor with information about the size and shape of your heart and how well your heart's chambers and valves are working. This procedure takes approximately one hour. There are no restrictions for this procedure. This will be done at our [facility] location:  [address]  Coronary CTA-see instructions below  Follow-Up: At [facility], you and your health needs are our priority.  As part of our continuing mission to provide you with exceptional heart care, we have created designated Provider Care Teams.  These Care Teams include your primary Cardiologist (physician) and Advanced Practice Providers (APPs -  Physician Assistants and Nurse Practitioners) who all work together to provide you with the care you need, when you need it.  We recommend signing up for the patient portal called "MyChart".  Sign up information is provided on this After Visit Summary.  MyChart is used to connect with patients for Virtual Visits (Telemedicine).  Patients are able to view lab/test results, encounter notes, upcoming appointments, etc.  Non-urgent messages can be sent to your provider as well.   To learn more about what you can do with MyChart, go to  [email or web address].    Your next appointment:   3 month(s)  The format for your next appointment:   In Person  Provider:   Oswaldo Milian, MD   Other Instructions Please check your blood pressure at home daily, write it down.  Call the office or send message via Mychart with the readings in 2 weeks for Dr. Gardiner Rhyme to review.       Coronary CTA instructions:   Your cardiac CT will be scheduled at one of the below locations:   [facility] [address] [place], [place] [address] [phone]  [place] [address] [place], [place] [address] [phone]  If scheduled at [facility], please arrive at the [facility] main entrance of [facility] 30 minutes prior to test start time. Proceed to the [facility] Radiology Department (first floor) to check-in and test prep.  If scheduled at [facility], please arrive 15 mins early for check-in and test prep.  Please follow these instructions carefully (unless otherwise directed):  On the Night Before the Test: . Be sure to Drink plenty of water. . Do not consume any caffeinated/decaffeinated beverages or chocolate 12 hours prior to your test. . Do not take any antihistamines 12 hours prior to your test.  On the Day of the Test: . Drink plenty of water. Do not drink any water within one hour of the test. . Do not eat any food 4 hours prior to the test. . You may take your regular medications prior to the test.  . Take metoprolol (Lopressor) two hours prior to test. . FEMALES- please wear underwire-free bra if available      After the Test: . Drink plenty of water. . After receiving IV contrast, you may experience a mild flushed feeling. This is normal. . On occasion, you may experience a mild rash up to 24 hours after the test. This is not dangerous. If this occurs, you can take Benadryl 25 mg and  increase your fluid intake. . If you experience trouble breathing, this can be serious. If it is severe call 911 IMMEDIATELY. If it is mild, please call our office. . If you take any of these  medications: Glipizide/Metformin, Avandament, Glucavance, please do not take 48 hours after completing test unless otherwise instructed.   Once we have confirmed authorization from your insurance company, we will call you to set up a date and time for your test. Based on how quickly your insurance processes prior authorizations requests, please allow up to 4 weeks to be contacted for scheduling your Cardiac CT appointment. Be advised that routine Cardiac CT appointments could be scheduled as many as 8 weeks after your provider has ordered it.  For non-scheduling related questions, please contact the cardiac imaging nurse navigator should you have any questions/concerns: Marchia Bond, Cardiac Imaging Nurse Navigator Burley Saver, Interim Cardiac Imaging Nurse [place] [facility] Direct Office Dial: [phone]   For scheduling needs, including cancellations and rescheduling, please call [place], [phone].       Signed, Donato Heinz, MD  02/28/2020 2:19 PM    [facility]

## 2020-02-28 ENCOUNTER — Other Ambulatory Visit: Payer: Self-pay

## 2020-02-28 ENCOUNTER — Encounter: Payer: Self-pay | Admitting: Cardiology

## 2020-02-28 ENCOUNTER — Ambulatory Visit: Payer: BC Managed Care – PPO | Admitting: Cardiology

## 2020-02-28 VITALS — BP 138/82 | HR 68 | Ht 69.0 in | Wt 223.2 lb

## 2020-02-28 DIAGNOSIS — R079 Chest pain, unspecified: Secondary | ICD-10-CM

## 2020-02-28 DIAGNOSIS — I10 Essential (primary) hypertension: Secondary | ICD-10-CM

## 2020-02-28 DIAGNOSIS — E785 Hyperlipidemia, unspecified: Secondary | ICD-10-CM | POA: Diagnosis not present

## 2020-02-28 MED ORDER — METOPROLOL TARTRATE 50 MG PO TABS: ORAL_TABLET | ORAL | 0 refills | Status: AC

## 2020-02-28 NOTE — Patient Instructions (Addendum)
Medication Instructions:  Your physician recommends that you continue on your current medications as directed. Please refer to the Current Medication list given to you today.  ---Morning of CT scan-take metoprolol tartrate (Lopressor) 50 mg TWO hours prior  Testing/Procedures: Your physician has requested that you have an echocardiogram. Echocardiography is a painless test that uses sound waves to create images of your heart. It provides your doctor with information about the size and shape of your heart and how well your heart's chambers and valves are working. This procedure takes approximately one hour. There are no restrictions for this procedure. This will be done at our [facility] location:  [address]  Coronary CTA-see instructions below  Follow-Up: At [facility], you and your health needs are our priority.  As part of our continuing mission to provide you with exceptional heart care, we have created designated Provider Care Teams.  These Care Teams include your primary Cardiologist (physician) and Advanced Practice Providers (APPs -  Physician Assistants and Nurse Practitioners) who all work together to provide you with the care you need, when you need it.  We recommend signing up for the patient portal called "MyChart".  Sign up information is provided on this After Visit Summary.  MyChart is used to connect with patients for Virtual Visits (Telemedicine).  Patients are able to view lab/test results, encounter notes, upcoming appointments, etc.  Non-urgent messages can be sent to your provider as well.   To learn more about what you can do with MyChart, go to [email or web address].    Your next appointment:   3 month(s)  The format for your next appointment:   In Person  Provider:   Oswaldo Milian, MD   Other Instructions Please check your blood pressure at home daily, write it down.  Call the office or send message via Mychart with the readings  in 2 weeks for Dr. Gardiner Rhyme to review.       Coronary CTA instructions:   Your cardiac CT will be scheduled at one of the below locations:   [facility] [address] [place], [place] [address] [phone]  [place] [address] [place], [place] [address] [phone]  If scheduled at [facility], please arrive at the [facility] main entrance of [facility] 30 minutes prior to test start time. Proceed to the [facility] Radiology Department (first floor) to check-in and test prep.  If scheduled at [facility], please arrive 15 mins early for check-in and test prep.  Please follow these instructions carefully (unless otherwise directed):  On the Night Before the Test: . Be sure to Drink plenty of water. . Do not consume any caffeinated/decaffeinated beverages or chocolate 12 hours prior to your test. . Do not take any antihistamines 12 hours prior to your test.  On the Day of the Test: . Drink plenty of water. Do not drink any water within one hour of the test. . Do not eat any food 4 hours prior to the test. . You may take your regular medications prior to the test.  . Take metoprolol (Lopressor) two hours prior to test. . FEMALES- please wear underwire-free bra if available      After the Test: . Drink plenty of water. . After receiving IV contrast, you may experience a mild flushed feeling. This is normal. . On occasion, you may experience a mild rash up to 24 hours  after the test. This is not dangerous. If this occurs, you can take Benadryl 25 mg and increase your fluid intake. . If you experience trouble breathing, this can be serious. If it is severe call 911 IMMEDIATELY. If it is mild, please call our office. . If you take any of these medications: Glipizide/Metformin, Avandament, Glucavance, please do not take 48 hours after completing test unless  otherwise instructed.   Once we have confirmed authorization from your insurance company, we will call you to set up a date and time for your test. Based on how quickly your insurance processes prior authorizations requests, please allow up to 4 weeks to be contacted for scheduling your Cardiac CT appointment. Be advised that routine Cardiac CT appointments could be scheduled as many as 8 weeks after your provider has ordered it.  For non-scheduling related questions, please contact the cardiac imaging nurse navigator should you have any questions/concerns: Marchia Bond, Cardiac Imaging Nurse Navigator Burley Saver, Interim Cardiac Imaging Nurse [place] [facility] Direct Office Dial: [phone]   For scheduling needs, including cancellations and rescheduling, please call [place], [phone].

## 2020-03-21 ENCOUNTER — Telehealth (HOSPITAL_COMMUNITY): Payer: Self-pay | Admitting: *Deleted

## 2020-03-21 NOTE — Telephone Encounter (Signed)
Reaching out to patient to offer assistance regarding upcoming cardiac imaging study; pt verbalizes understanding of appt date/time, parking situation and where to check in, pre-test NPO status and medications ordered, and verified current allergies; name and call back number provided for further questions should they arise  Lynda Capistran RN Navigator Cardiac Imaging [facility] [phone] office [phone] cell  

## 2020-03-22 ENCOUNTER — Ambulatory Visit (HOSPITAL_COMMUNITY): Payer: BC Managed Care – PPO | Attending: Cardiology

## 2020-03-22 ENCOUNTER — Other Ambulatory Visit: Payer: Self-pay

## 2020-03-22 DIAGNOSIS — R079 Chest pain, unspecified: Secondary | ICD-10-CM | POA: Diagnosis present

## 2020-03-22 LAB — ECHOCARDIOGRAM COMPLETE
Area-P 1/2: 3.17 cm2
S' Lateral: 3.4 cm

## 2020-03-23 ENCOUNTER — Ambulatory Visit (HOSPITAL_COMMUNITY)
Admission: RE | Admit: 2020-03-23 | Discharge: 2020-03-23 | Disposition: A | Discharge status: Home / Self Care | Payer: BC Managed Care – PPO | Source: Ambulatory Visit | Attending: Cardiology | Admitting: Cardiology

## 2020-03-23 DIAGNOSIS — R079 Chest pain, unspecified: Secondary | ICD-10-CM | POA: Insufficient documentation

## 2020-03-23 DIAGNOSIS — Z006 Encounter for examination for normal comparison and control in clinical research program: Secondary | ICD-10-CM

## 2020-03-23 MED ORDER — NITROGLYCERIN 0.4 MG SL SUBL
0.8000 mg | SUBLINGUAL_TABLET | Freq: Once | SUBLINGUAL | Status: AC
  Administered 2020-03-23: 0.8 mg via SUBLINGUAL

## 2020-03-23 MED ORDER — NITROGLYCERIN 0.4 MG SL SUBL
SUBLINGUAL_TABLET | SUBLINGUAL | Status: AC
  Filled 2020-03-23: qty 2

## 2020-03-23 MED ORDER — IOHEXOL 350 MG/ML SOLN
80.0000 mL | Freq: Once | INTRAVENOUS | Status: AC | PRN
  Administered 2020-03-23: 80 mL via INTRAVENOUS

## 2020-03-23 NOTE — Research (Signed)
IDENTIFY Informed Consent                  Subject Name: Cheryl Schwartz   Subject met inclusion and exclusion criteria.  The informed consent form, study requirements and expectations were reviewed with the subject and questions and concerns were addressed prior to the signing of the consent form.  The subject verbalized understanding of the trial requirements.  The subject agreed to participate in the IDENTIFY trial and signed the informed consent at 14:21PM on 03/23/20.  The informed consent was obtained prior to performance of any protocol-specific procedures for the subject.  A copy of the signed informed consent was given to the subject and a copy was placed in the subject's medical record.   Meade Maw, Naval architect

## 2020-05-29 NOTE — Progress Notes (Signed)
Cardiology Office Note:    Date:  05/30/2020   ID:  Cheryl Schwartz, DOB 01/28/1966, MRN [id number]  PCP:  Jarrett Soho, PA-C  Cardiologist:  None  Electrophysiologist:  None   Referring MD: Jarrett Soho, PA-C   Chief Complaint  Patient presents with  . Chest Pain    History of Present Illness:    Cheryl Schwartz is a 55 y.o. female with a hx of hypertension, hyperlipidemia who presents for follow-up.  She was initially seen on 02/28/2020 as an ED follow-up for chest pain. She was seen in the ED for chest pain on 02/14/20. EKG unremarkable and negative troponins x2.  She reports that she went for a walk the day prior to her ED visit.  She was walking through the snow and felt like her heart was racing.  About an hour after this had onset of chest pain.  Describes as pressure in the center of her chest, felt like someone was sitting on her chest.  Chest pain lasted into the next day, about 35 hours.  Went to the ED and work-up was unremarkable as above.  Reports she walks her dog every day for 20-25 minutes.  Denies any chest pain with this.  Denies any dyspnea, lightheadedness, syncope, or palpitations.  Does report she gets occasional lower extremity edema.  She was started on chlorthalidone last week for elevated blood pressure.  No smoking history.  Family history includes father had MI at age 6 and then another MI at 27 and underwent CABG x4.  Coronary CTA on 03/23/2020 showed nonobstructive CAD with plaque in the proximal LAD causing 0 to 24% stenosis, calcium score 0.  Echocardiogram on 04/19/2020 showed normal biventricular function, no significant valvular disease.  Since last clinic visit, reports has been doing well.  States that chest pain has resolved.  Denies any dyspnea, lightheadedness, syncope, or palpitations.  Does report occasional mild lower extremity edema.  Walks her dog for exercise, typically about 25 to 30 minutes/day.   BP Readings from Last 3 Encounters:  05/30/20  132/88  03/23/20 130/88  02/28/20 138/82     Past Medical History:  Diagnosis Date  . High cholesterol   . Hypertension     Past Surgical History:  Procedure Laterality Date  . CESAREAN SECTION    . ECTOPIC PREGNANCY SURGERY  1997    Current Medications: Current Meds  Medication Sig  . albuterol (VENTOLIN HFA) 108 (90 Base) MCG/ACT inhaler Inhale 2 puffs into the lungs every 4 (four) hours as needed for shortness of breath or wheezing.  . budesonide-formoterol (SYMBICORT) 80-4.5 MCG/ACT inhaler Inhale 2 puffs into the lungs 2 (two) times a day.  Marland Kitchen buPROPion (WELLBUTRIN SR) 100 MG 12 hr tablet Take 100 mg by mouth in the morning.  . chlorthalidone (HYGROTON) 25 MG tablet Take 1 tablet by mouth daily.  . metoprolol tartrate (LOPRESSOR) 50 MG tablet Take 50 mg (1 tablet) TWO HOURS prior to CT scan  . ondansetron (ZOFRAN) 4 MG tablet Take 4 mg by mouth every 8 (eight) hours as needed for nausea or vomiting.  . verapamil (CALAN-SR) 240 MG CR tablet Take 120 mg by mouth in the morning and at bedtime.  . [DISCONTINUED] pravastatin (PRAVACHOL) 80 MG tablet Take 80 mg by mouth daily.  . rosuvastatin (CRESTOR) 20 MG tablet Take 1 tablet (20 mg total) by mouth daily.     Allergies:   Zoloft [sertraline] and Tape   Social History   Socioeconomic History  . Marital  status: Married    Spouse name: Not on file  . Number of children: Not on file  . Years of education: Not on file  . Highest education level: Not on file  Occupational History  . Not on file  Tobacco Use  . Smoking status: Never Smoker  . Smokeless tobacco: Never Used  Vaping Use  . Vaping Use: Never used  Substance and Sexual Activity  . Alcohol use: Never  . Drug use: Never  . Sexual activity: Not on file  Other Topics Concern  . Not on file  Social History Narrative  . Not on file   Social Determinants of Health   Financial Resource Strain: Not on file  Food Insecurity: Not on file  Transportation Needs:  Not on file  Physical Activity: Not on file  Stress: Not on file  Social Connections: Not on file     Family History: The patient's family history includes Emphysema in her father.  ROS:   Please see the history of present illness.     All other systems reviewed and are negative.  EKGs/Labs/Other Studies Reviewed:    The following studies were reviewed today:   EKG:  EKG is not ordered today.  The ekg ordered at prior clinic demonstrates normal sinus rhythm, rate 68, no ST/T abnormalities  Recent Labs: 02/14/2020: BUN 10; Creatinine, Ser 0.81; Hemoglobin 12.2; Platelets 236; Potassium 3.8; Sodium 139  Recent Lipid Panel No results found for: CHOL, TRIG, HDL, CHOLHDL, VLDL, LDLCALC, LDLDIRECT  Physical Exam:    VS:  BP 132/88   Pulse 85   Ht 5\' 10"  (1.778 m)   Wt 229 lb 3.2 oz (104 kg)   SpO2 96%   BMI 32.89 kg/m     Wt Readings from Last 3 Encounters:  05/30/20 229 lb 3.2 oz (104 kg)  02/28/20 223 lb 3.2 oz (101.2 kg)  02/14/20 217 lb (98.4 kg)     02/16/20 nourished, well developed in no acute distress HEENT: Normal NECK: No JVD; No carotid bruits CARDIAC: RRR, no murmurs, rubs, gallops RESPIRATORY:  Clear to auscultation without rales, wheezing or rhonchi  ABDOMEN: Soft, non-tender, non-distended MUSCULOSKELETAL:  No edema; No deformity  SKIN: Warm and dry NEUROLOGIC:  Alert and oriented x 3 PSYCHIATRIC:  Normal affect   ASSESSMENT:    1. Hyperlipidemia, unspecified hyperlipidemia type   2. Chest pain of uncertain etiology   3. Essential hypertension    PLAN:    Chest pain: Atypical in description.  Coronary CTA on 03/23/2020 showed nonobstructive CAD with plaque in the proximal LAD causing 0 to 24% stenosis, calcium score 0.  Echocardiogram on 04/19/2020 showed normal biventricular function, no significant valvular disease.  CT also showed esophageal air-fluid level suggesting dysmotility or GERD.  Consider PPI if chest pain recurs.  Hypertension: On  chlorthalidone 25 mg daily, verapamil 120 mg twice daily.  Mildly elevated in clinic today, asked to check BP twice daily for next 2 weeks and call with results.  Hyperlipidemia: On pravastatin 80 mg.  LDL 122 on 02/23/2020.  Recommend switching to higher intensity statin, will switch to rosuvastatin 20 mg daily.  Repeat fasting lipid panel in 2 to 3 months  RTC in 1 year  Medication Adjustments/Labs and Tests Ordered: Current medicines are reviewed at length with the patient today.  Concerns regarding medicines are outlined above.  Orders Placed This Encounter  Procedures  . Lipid panel   Meds ordered this encounter  Medications  . rosuvastatin (CRESTOR) 20 MG  tablet    Sig: Take 1 tablet (20 mg total) by mouth daily.    Dispense:  90 tablet    Refill:  3    There are no Patient Instructions on file for this visit.   Signed, Little Ishikawa, MD  05/30/2020 10:40 AM    [facility]

## 2020-05-30 ENCOUNTER — Ambulatory Visit: Payer: BC Managed Care – PPO | Admitting: Cardiology

## 2020-05-30 ENCOUNTER — Encounter: Payer: Self-pay | Admitting: Cardiology

## 2020-05-30 ENCOUNTER — Other Ambulatory Visit: Payer: Self-pay

## 2020-05-30 VITALS — BP 132/88 | HR 85 | Ht 70.0 in | Wt 229.2 lb

## 2020-05-30 DIAGNOSIS — I10 Essential (primary) hypertension: Secondary | ICD-10-CM | POA: Diagnosis not present

## 2020-05-30 DIAGNOSIS — E785 Hyperlipidemia, unspecified: Secondary | ICD-10-CM | POA: Diagnosis not present

## 2020-05-30 DIAGNOSIS — R079 Chest pain, unspecified: Secondary | ICD-10-CM

## 2020-05-30 MED ORDER — ROSUVASTATIN CALCIUM 20 MG PO TABS: 20.0000 mg | ORAL_TABLET | Freq: Every day | ORAL | 3 refills | Status: DC

## 2020-05-30 NOTE — Patient Instructions (Signed)
Medication Instructions:  STOP Pravastatin  BEGIN Rosuvastatin 20mg  once daily.  *If you need a refill on your cardiac medications before your next appointment, please call your pharmacy*   Lab Work: Lipid panel to be drawn in 2-3 months.  If you have labs (blood work) drawn today and your tests are completely normal, you will receive your results only by: MyChart Message (if you have MyChart) OR . A paper copy in the mail If you have any lab test that is abnormal or we need to change your treatment, we will call you to review the results.   Testing/Procedures: None ordered   Follow-Up: At [facility], you and your health needs are our priority.  As part of our continuing mission to provide you with exceptional heart care, we have created designated Provider Care Teams.  These Care Teams include your primary Cardiologist (physician) and Advanced Practice Providers (APPs -  Physician Assistants and Nurse Practitioners) who all work together to provide you with the care you need, when you need it.  We recommend signing up for the patient portal called "MyChart".  Sign up information is provided on this After Visit Summary.  MyChart is used to connect with patients for Virtual Visits (Telemedicine).  Patients are able to view lab/test results, encounter notes, upcoming appointments, etc.  Non-urgent messages can be sent to your provider as well.   To learn more about what you can do with MyChart, go to [facility].    Your next appointment:   12 month(s)  The format for your next appointment:   In Person  Provider:   [email or web address], MD   Other Instructions Take and record blood pressures 2 times daily for 1 week and call office with results.

## 2020-07-12 ENCOUNTER — Telehealth: Payer: Self-pay

## 2020-07-12 DIAGNOSIS — Z006 Encounter for examination for normal comparison and control in clinical research program: Secondary | ICD-10-CM

## 2020-07-12 NOTE — Telephone Encounter (Signed)
I have attempted without success to contact this patient by phone for her Identify 90 day follow up phone call. I left a message for patient to return my phone call with my name and callback number. An e-mail was also sent to patient.  

## 2020-07-18 ENCOUNTER — Telehealth: Payer: Self-pay

## 2020-07-18 DIAGNOSIS — Z006 Encounter for examination for normal comparison and control in clinical research program: Secondary | ICD-10-CM

## 2020-07-18 NOTE — Telephone Encounter (Signed)
I called patient for her 90-day Identify Study follow up phone call. Patient is doing well with no cardiac symptoms at this time. I reminded patient I would call her in February for her 1 year follow-up. 

## 2021-02-15 ENCOUNTER — Other Ambulatory Visit: Payer: Self-pay | Admitting: Family Medicine

## 2021-02-15 DIAGNOSIS — Z1231 Encounter for screening mammogram for malignant neoplasm of breast: Secondary | ICD-10-CM

## 2021-03-07 ENCOUNTER — Ambulatory Visit
Admission: RE | Admit: 2021-03-07 | Discharge: 2021-03-07 | Disposition: A | Discharge status: Home / Self Care | Payer: BC Managed Care – PPO | Source: Ambulatory Visit | Attending: Family Medicine | Admitting: Family Medicine

## 2021-03-07 DIAGNOSIS — Z1231 Encounter for screening mammogram for malignant neoplasm of breast: Secondary | ICD-10-CM

## 2021-06-06 ENCOUNTER — Other Ambulatory Visit: Payer: Self-pay | Admitting: Cardiology

## 2021-07-06 ENCOUNTER — Other Ambulatory Visit: Payer: Self-pay | Admitting: Cardiology

## 2021-07-30 ENCOUNTER — Other Ambulatory Visit: Payer: Self-pay | Admitting: Cardiology

## 2021-08-30 ENCOUNTER — Other Ambulatory Visit: Payer: Self-pay | Admitting: Cardiology

## 2022-03-29 ENCOUNTER — Other Ambulatory Visit: Payer: Self-pay | Admitting: Nurse Practitioner

## 2022-03-29 DIAGNOSIS — Z780 Asymptomatic menopausal state: Secondary | ICD-10-CM

## 2022-03-29 DIAGNOSIS — Z1231 Encounter for screening mammogram for malignant neoplasm of breast: Secondary | ICD-10-CM

## 2022-04-03 ENCOUNTER — Other Ambulatory Visit: Payer: Self-pay | Admitting: Sports Medicine

## 2022-04-03 ENCOUNTER — Ambulatory Visit
Admission: RE | Admit: 2022-04-03 | Discharge: 2022-04-03 | Disposition: A | Discharge status: Home / Self Care | Payer: BC Managed Care – PPO | Source: Ambulatory Visit | Attending: Sports Medicine | Admitting: Sports Medicine

## 2022-04-03 DIAGNOSIS — M25551 Pain in right hip: Secondary | ICD-10-CM

## 2022-05-14 ENCOUNTER — Ambulatory Visit
Admission: RE | Admit: 2022-05-14 | Discharge: 2022-05-14 | Disposition: A | Discharge status: Home / Self Care | Payer: BC Managed Care – PPO | Source: Ambulatory Visit | Attending: Nurse Practitioner | Admitting: Nurse Practitioner

## 2022-05-14 DIAGNOSIS — Z1231 Encounter for screening mammogram for malignant neoplasm of breast: Secondary | ICD-10-CM

## 2022-08-13 ENCOUNTER — Emergency Department (HOSPITAL_BASED_OUTPATIENT_CLINIC_OR_DEPARTMENT_OTHER): Payer: BC Managed Care – PPO | Admitting: Radiology

## 2022-08-13 ENCOUNTER — Emergency Department (HOSPITAL_BASED_OUTPATIENT_CLINIC_OR_DEPARTMENT_OTHER)
Admission: EM | Admit: 2022-08-13 | Discharge: 2022-08-13 | Disposition: A | Discharge status: Home / Self Care | Payer: BC Managed Care – PPO | Attending: Emergency Medicine | Admitting: Emergency Medicine

## 2022-08-13 ENCOUNTER — Encounter (HOSPITAL_BASED_OUTPATIENT_CLINIC_OR_DEPARTMENT_OTHER): Payer: Self-pay

## 2022-08-13 DIAGNOSIS — I10 Essential (primary) hypertension: Secondary | ICD-10-CM | POA: Insufficient documentation

## 2022-08-13 DIAGNOSIS — J45909 Unspecified asthma, uncomplicated: Secondary | ICD-10-CM | POA: Insufficient documentation

## 2022-08-13 DIAGNOSIS — Z79899 Other long term (current) drug therapy: Secondary | ICD-10-CM | POA: Diagnosis not present

## 2022-08-13 DIAGNOSIS — R0602 Shortness of breath: Secondary | ICD-10-CM | POA: Diagnosis not present

## 2022-08-13 LAB — CBC
HCT: 34.9 % — ABNORMAL LOW (ref 36.0–46.0)
Hemoglobin: 11.8 g/dL — ABNORMAL LOW (ref 12.0–15.0)
MCH: 29.4 pg (ref 26.0–34.0)
MCHC: 33.8 g/dL (ref 30.0–36.0)
MCV: 87 fL (ref 80.0–100.0)
Platelets: 219 10*3/uL (ref 150–400)
RBC: 4.01 MIL/uL (ref 3.87–5.11)
RDW: 12.5 % (ref 11.5–15.5)
WBC: 5 10*3/uL (ref 4.0–10.5)
nRBC: 0 % (ref 0.0–0.2)

## 2022-08-13 LAB — BASIC METABOLIC PANEL
Anion gap: 8 (ref 5–15)
BUN: 10 mg/dL (ref 6–20)
CO2: 28 mmol/L (ref 22–32)
Calcium: 9.4 mg/dL (ref 8.9–10.3)
Chloride: 104 mmol/L (ref 98–111)
Creatinine, Ser: 0.86 mg/dL (ref 0.44–1.00)
GFR, Estimated: >60 mL/min (ref >60)
Glucose, Bld: 91 mg/dL (ref 70–99)
Potassium: 3.8 mmol/L (ref 3.5–5.1)
Sodium: 140 mmol/L (ref 135–145)

## 2022-08-13 LAB — BRAIN NATRIURETIC PEPTIDE: B Natriuretic Peptide: 15.9 pg/mL (ref 0.0–100.0)

## 2022-08-13 LAB — TROPONIN I (HIGH SENSITIVITY)
Troponin I (High Sensitivity): 2 ng/L (ref <18)
Troponin I (High Sensitivity): 3 ng/L (ref <18)

## 2022-08-13 NOTE — Discharge Instructions (Addendum)
Evaluation for your shortness of breath was overall reassuring.  Recommend you follow-up PCP.  If your shortness of breath gets worse, you develop a fever, chest pain worsens or any other concern please return emergency department further evaluation.

## 2022-08-13 NOTE — ED Triage Notes (Signed)
Patient here POV from Home.  Endorses Onset of SOB that began 12 Days ago.   Sent by PCP for assessment. Recent Flight. No Known Fevers. Some Throat/Upper Chest Pain. Some Nausea. No emesis or Diarrhea.   NAD Noted during Triage. A&Ox4. Gcs 15. Ambulatory.

## 2022-08-13 NOTE — ED Provider Notes (Signed)
[place] EMERGENCY DEPARTMENT AT [facility] Provider Note   CSN: [id number] Arrival date & time: 08/13/22  1736     History  Chief Complaint  Patient presents with   Shortness of Breath   HPI Cheryl Schwartz is a 57 y.o. female with hypertension and high cholesterol and asthma presenting for shortness of breath.  States has been going on for 12 days.  When it started, she had just come home after a flight from [place].  She is to be worse with exertion.  Has some upper chest tightness that is intermittent.  Shortness of breath is nonpleuritic.  Denies any swelling in her lower extremities and recent weight gain.  Denies calf tenderness.  Denies OCP use.  Denies fever and cough.   Shortness of Breath      Home Medications Prior to Admission medications   Medication Sig Start Date End Date Taking? Authorizing Provider  albuterol (VENTOLIN HFA) 108 (90 Base) MCG/ACT inhaler Inhale 2 puffs into the lungs every 4 (four) hours as needed for shortness of breath or wheezing. 05/09/18   [provider]  budesonide-formoterol (SYMBICORT) 80-4.5 MCG/ACT inhaler Inhale 2 puffs into the lungs 2 (two) times a day. 06/10/18   Nyoka Cowden, MD  buPROPion (WELLBUTRIN SR) 100 MG 12 hr tablet Take 100 mg by mouth in the morning.    [provider]  chlorthalidone (HYGROTON) 25 MG tablet Take 1 tablet by mouth daily. 06/10/18   [provider]  metoprolol tartrate (LOPRESSOR) 50 MG tablet Take 50 mg (1 tablet) TWO HOURS prior to CT scan 02/28/20   Little Ishikawa, MD  ondansetron (ZOFRAN) 4 MG tablet Take 4 mg by mouth every 8 (eight) hours as needed for nausea or vomiting.    [provider]  rosuvastatin (CRESTOR) 20 MG tablet Take 1 tablet (20 mg total) by mouth daily. Please schedule appointment for further refills. 07/30/21   Little Ishikawa, MD  verapamil (CALAN-SR) 240 MG CR tablet Take 120 mg by mouth in the morning and at bedtime.  05/18/18   [provider]      Allergies    Zoloft [sertraline] and Tape    Review of Systems   Review of Systems  Respiratory:  Positive for shortness of breath.     Physical Exam Updated Vital Signs BP (!) 159/99 (BP Location: Right Arm)   Pulse 71   Temp 98.6 F (37 C) (Oral)   Resp 18   Ht 5\' 9"  (1.753 m)   Wt 102.1 kg   SpO2 97%   BMI 33.23 kg/m  Physical Exam Vitals and nursing note reviewed.  HENT:     Head: Normocephalic and atraumatic.     Mouth/Throat:     Mouth: Mucous membranes are moist.  Eyes:     General:        Right eye: No discharge.        Left eye: No discharge.     Conjunctiva/sclera: Conjunctivae normal.  Cardiovascular:     Rate and Rhythm: Normal rate and regular rhythm.     Pulses: Normal pulses.     Heart sounds: Normal heart sounds.  Pulmonary:     Effort: Pulmonary effort is normal.     Breath sounds: Normal breath sounds.  Abdominal:     General: Abdomen is flat.     Palpations: Abdomen is soft.  Skin:    General: Skin is warm and dry.  Neurological:     General:  No focal deficit present.  Psychiatric:        Mood and Affect: Mood normal.     ED Results / Procedures / Treatments   Labs (all labs ordered are listed, but only abnormal results are displayed) Labs Reviewed  CBC - Abnormal; Notable for the following components:      Result Value   Hemoglobin 11.8 (*)    HCT 34.9 (*)    All other components within normal limits  BASIC METABOLIC PANEL  BRAIN NATRIURETIC PEPTIDE  TROPONIN I (HIGH SENSITIVITY)  TROPONIN I (HIGH SENSITIVITY)    EKG EKG Interpretation Date/Time:  Tuesday August 13 2022 17:50:05 EDT Ventricular Rate:  78 PR Interval:  160 QRS Duration:  82 QT Interval:  374 QTC Calculation: 426 R Axis:   10  Text Interpretation: Normal sinus rhythm Abnormal QRS-T angle, consider primary T wave abnormality Abnormal ECG When compared with ECG of 14-Feb-2020 14:56, No significant change was found  Similar to previous Confirmed by Coralee Pesa [id number]) on 08/13/2022 11:00:36 PM  Radiology DG Chest 2 View  Result Date: 08/13/2022 CLINICAL DATA:  Shortness of breath EXAM: CHEST - 2 VIEW COMPARISON:  02/14/2020 FINDINGS: The heart size and mediastinal contours are within normal limits. Aortic atherosclerosis. Both lungs are clear. The visualized skeletal structures are unremarkable. IMPRESSION: No active cardiopulmonary disease. Electronically Signed   By: Jasmine Pang M.D.   On: 08/13/2022 19:18    Procedures Procedures    Medications Ordered in ED Medications - No data to display  ED Course/ Medical Decision Making/ A&P                             Medical Decision Making Amount and/or Complexity of Data Reviewed Labs: ordered. Radiology: ordered.   Initial Impression and Ddx 57 year old well-appearing female presenting for shortness of breath and chest tightness.  Exam was unremarkable.  DDx includes PE, ACS, pneumonia, pneumothorax, and CHF exacerbation. Patient PMH that increases complexity of ED encounter:  hypertension and high cholesterol and asthma   Interpretation of Diagnostics - I independent reviewed and interpreted the labs as followed: Mild anemia  - I independently visualized the following imaging with scope of interpretation limited to determining acute life threatening conditions related to emergency care: CXR, which revealed no acute cardiopulmonary process  -I personally reviewed and interpreted EKG which revealed normal sinus rhythm  Patient Reassessment and Ultimate Disposition/Management Patient appears clinically well, hemodynamically stable and in no acute distress. Considered PE but unlikely given, no calf tenderness not on OCP, not tachycardic nor hypoxic and chest pain is nonpleuritic, also troponins are low making right heart strain secondary to PE very unlikely. ACS and CHF exacerbation.  Evaluation unremarkable. Considered asthma as well but  patient sounds clear on exam.  Advised patient follow-up with her PCP.  Vital stable throughout encounter. Discussed pertinent return precautions.  Discharged home.  Patient management required discussion with the following services or consulting groups:  None  Complexity of Problems Addressed Acute complicated illness or Injury  Additional Data Reviewed and Analyzed Further history obtained from: Recent discharge summary  Patient Encounter Risk Assessment None         Final Clinical Impression(s) / ED Diagnoses Final diagnoses:  Shortness of breath    Rx / DC Orders ED Discharge Orders     None         Gareth Eagle, PA-C 08/13/22 2310    Horton, Clabe Seal, DO  08/13/22 2350  

## 2022-09-11 ENCOUNTER — Ambulatory Visit
Admission: RE | Admit: 2022-09-11 | Discharge: 2022-09-11 | Disposition: A | Discharge status: Home / Self Care | Payer: BC Managed Care – PPO | Source: Ambulatory Visit | Attending: Nurse Practitioner | Admitting: Nurse Practitioner

## 2022-09-11 DIAGNOSIS — Z780 Asymptomatic menopausal state: Secondary | ICD-10-CM

## 2022-10-16 ENCOUNTER — Ambulatory Visit: Payer: BC Managed Care – PPO | Admitting: Podiatry

## 2022-10-28 ENCOUNTER — Encounter: Payer: Self-pay | Admitting: Podiatry

## 2022-10-28 ENCOUNTER — Ambulatory Visit (INDEPENDENT_AMBULATORY_CARE_PROVIDER_SITE_OTHER): Payer: BC Managed Care – PPO

## 2022-10-28 ENCOUNTER — Ambulatory Visit: Payer: BC Managed Care – PPO | Admitting: Podiatry

## 2022-10-28 DIAGNOSIS — M722 Plantar fascial fibromatosis: Secondary | ICD-10-CM

## 2022-10-28 DIAGNOSIS — M79672 Pain in left foot: Secondary | ICD-10-CM

## 2022-10-28 MED ORDER — MELOXICAM 15 MG PO TABS: 15.0000 mg | ORAL_TABLET | Freq: Every day | ORAL | 1 refills | Status: AC

## 2022-10-28 MED ORDER — METHYLPREDNISOLONE 4 MG PO TBPK: ORAL_TABLET | ORAL | 0 refills | Status: AC

## 2022-10-28 MED ORDER — BETAMETHASONE SOD PHOS & ACET 6 (3-3) MG/ML IJ SUSP: 3.0000 mg | Freq: Once | INTRAMUSCULAR | Status: AC

## 2022-10-28 NOTE — Progress Notes (Signed)
   Chief Complaint  Patient presents with   Foot Pain    LEFT FOOT PAIN(MIDFOOT/ ARCH) , STARTED 08/2022, HX OF PF    Subjective: 57 y.o. female presenting today as a new patient for evaluation of left heel pain ongoing for few months now.  Sudden onset of left foot arch pain when she was walking across a hotel conference room.  Patient does have a long history of plantar fasciitis.  She currently wears orthotics that are about 14-1 years old.   Past Medical History:  Diagnosis Date   High cholesterol    Hypertension    Past Surgical History:  Procedure Laterality Date   CESAREAN SECTION     ECTOPIC PREGNANCY SURGERY  1997   Allergies  Allergen Reactions   Zoloft [Sertraline] Nausea And Vomiting   Tape Rash and Other (See Comments)    Medical tape     Objective: Physical Exam General: The patient is alert and oriented x3 in no acute distress.  Dermatology: Skin is warm, dry and supple bilateral lower extremities. Negative for open lesions or macerations bilateral.   Vascular: Dorsalis Pedis and Posterior Tibial pulses palpable bilateral.  Capillary fill time is immediate to all digits.  Neurological: Grossly intact via light touch  Musculoskeletal: Tenderness to palpation to the plantar aspect of the left heel along the plantar fascia. All other joints range of motion within normal limits bilateral. Strength 5/5 in all groups bilateral.   Radiographic exam LT foot 10/28/2022: Normal osseous mineralization. Joint spaces preserved. No fracture/dislocation/boney destruction. No other soft tissue abnormalities or radiopaque foreign bodies.  Impression: Negative  Assessment: 1. Plantar fasciitis left foot  Plan of Care:  -Patient evaluated.  X-rays reviewed -Injection of 0.5 cc Celestone Soluspan injected into the midportion of the plantar fascia left -Prescription for Medrol Dosepak -Prescription for meloxicam 15 mg daily after completion of the Dosepak -Appointment with  orthotics department for new custom molded orthotics to support the medial longitudinal arch of the foot and alleviate the pressure from the plantar fascia -Orthotics order placed -Return to clinic in 4 weeks  *Spent time living in [place], [place]   Felecia Shelling, DPM [facility]  Dr. Felecia Shelling, DPM    [address] [place], [place] [address]                Office [phone]  Fax [phone]

## 2022-11-11 ENCOUNTER — Ambulatory Visit (INDEPENDENT_AMBULATORY_CARE_PROVIDER_SITE_OTHER): Payer: BC Managed Care – PPO

## 2022-11-11 DIAGNOSIS — M722 Plantar fascial fibromatosis: Secondary | ICD-10-CM

## 2022-11-11 NOTE — Progress Notes (Addendum)
  Patient was seen, measured / scanned for custom molded foot orthotics.  Patient will benefit from CFO's as they will help provide total contact to MLA's helping to better distribute body weight across BIL feet greater reducing plantar pressure and pain and to also encourage FF and RF alignment.  Patient was scanned items to be ordered and fit when in  Cheryl Schwartz, CFo, CFm  Patient requested a softer accommodative inserts  Charges added

## 2022-12-25 ENCOUNTER — Ambulatory Visit: Payer: BC Managed Care – PPO

## 2022-12-25 DIAGNOSIS — M722 Plantar fascial fibromatosis: Secondary | ICD-10-CM

## 2022-12-25 NOTE — Progress Notes (Signed)
Patient presents today to pick up custom molded foot orthotics recommended by Dr. Gala Lewandowsky.   Orthotics were dispensed and fit was satisfactory. Reviewed instructions for break-in and wear. Written instructions were given to the patient.  Patient will follow up as needed.

## 2023-04-02 ENCOUNTER — Other Ambulatory Visit: Payer: Self-pay | Admitting: Nurse Practitioner

## 2023-04-02 ENCOUNTER — Other Ambulatory Visit (HOSPITAL_COMMUNITY)
Admission: RE | Admit: 2023-04-02 | Discharge: 2023-04-02 | Disposition: A | Discharge status: Home / Self Care | Source: Ambulatory Visit | Attending: Nurse Practitioner | Admitting: Nurse Practitioner

## 2023-04-02 DIAGNOSIS — Z124 Encounter for screening for malignant neoplasm of cervix: Secondary | ICD-10-CM | POA: Diagnosis present

## 2023-04-04 LAB — CYTOLOGY - PAP
Comment: NEGATIVE
Diagnosis: NEGATIVE
High risk HPV: NEGATIVE

## 2023-06-18 ENCOUNTER — Other Ambulatory Visit: Payer: Self-pay | Admitting: Family Medicine

## 2023-06-18 DIAGNOSIS — Z1231 Encounter for screening mammogram for malignant neoplasm of breast: Secondary | ICD-10-CM

## 2023-06-25 ENCOUNTER — Ambulatory Visit
Admission: RE | Admit: 2023-06-25 | Discharge: 2023-06-25 | Disposition: A | Discharge status: Home / Self Care | Source: Ambulatory Visit | Attending: Family Medicine | Admitting: Family Medicine

## 2023-06-25 DIAGNOSIS — Z1231 Encounter for screening mammogram for malignant neoplasm of breast: Secondary | ICD-10-CM

## 2023-08-11 IMAGING — MG MM DIGITAL SCREENING BILAT W/ TOMO AND CAD
8 series · 8 of 24 positions shown · non-contrast
Comparison: Previous exam(s).

CLINICAL DATA: Screening.

EXAM:
DIGITAL SCREENING BILATERAL MAMMOGRAM WITH TOMOSYNTHESIS AND CAD
TECHNIQUE: Bilateral screening digital craniocaudal and mediolateral oblique
mammograms were obtained. Bilateral screening digital breast
tomosynthesis was performed. The images were evaluated with
computer-aided detection.

[R CC synth-2D]
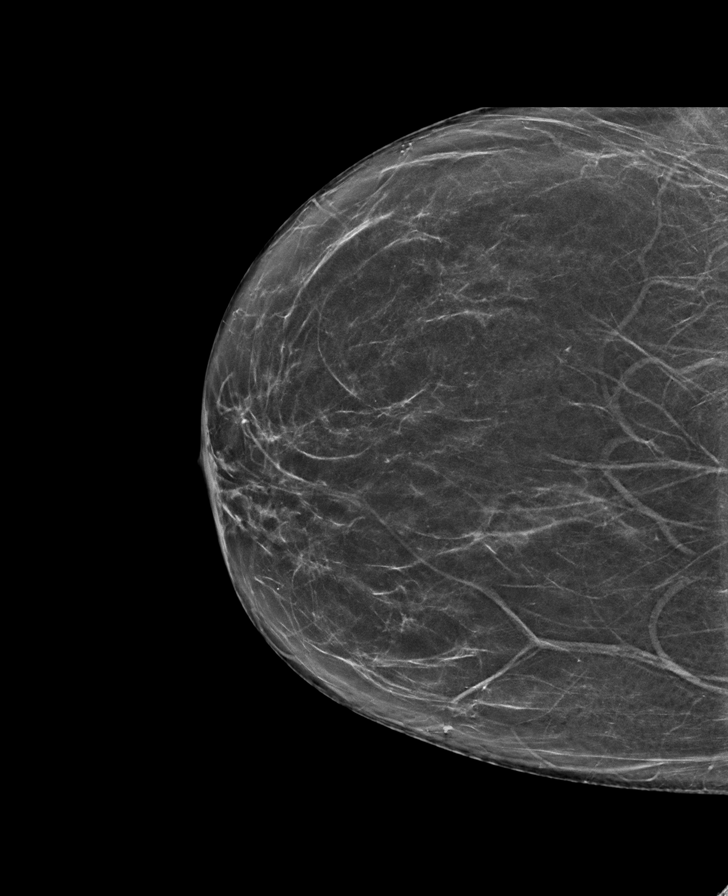

[L CC synth-2D]
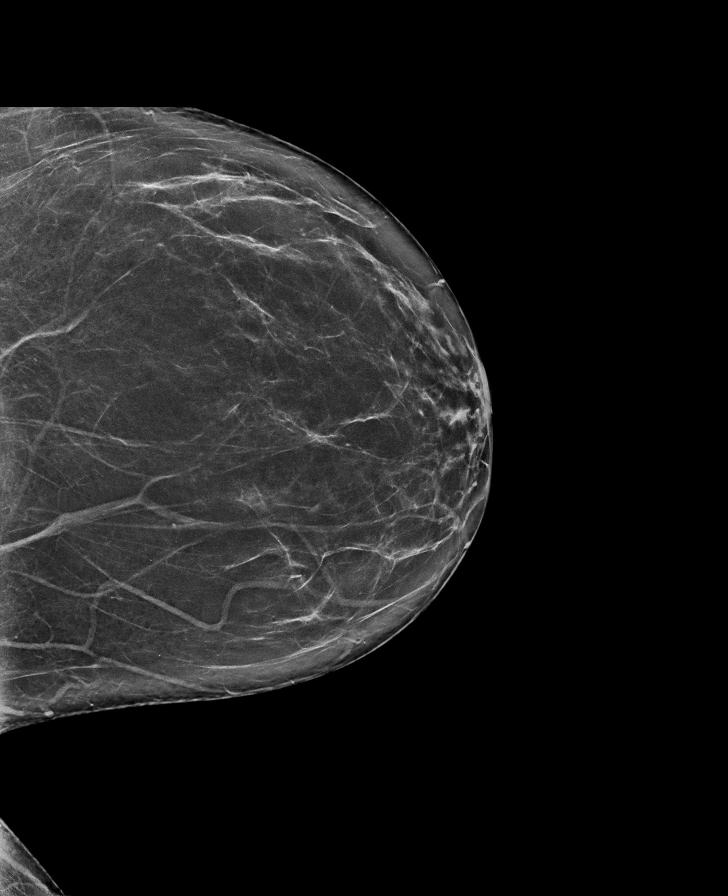

[L MLO synth-2D]
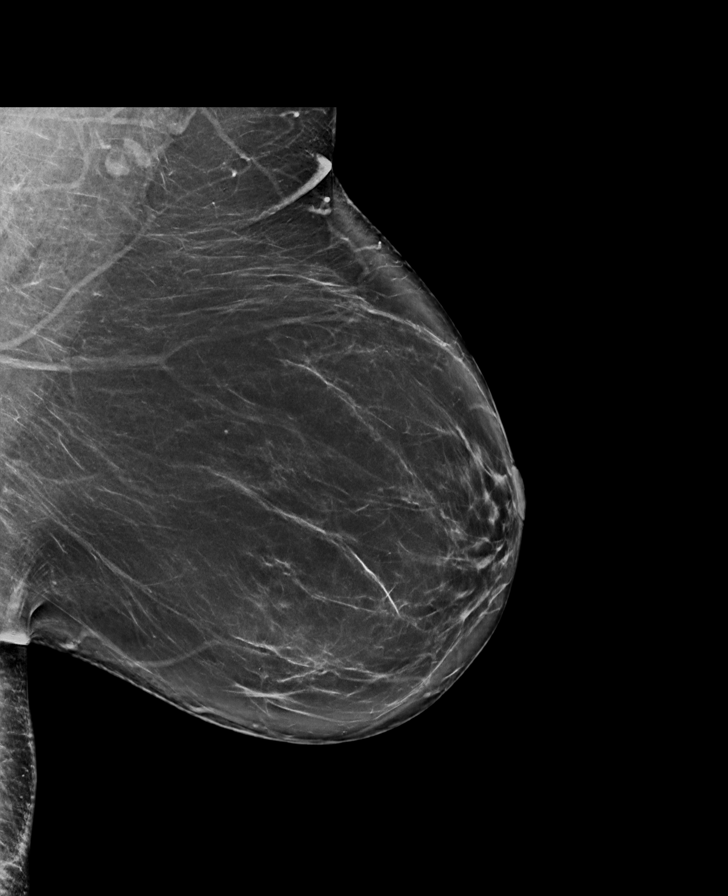

[R MLO synth-2D]
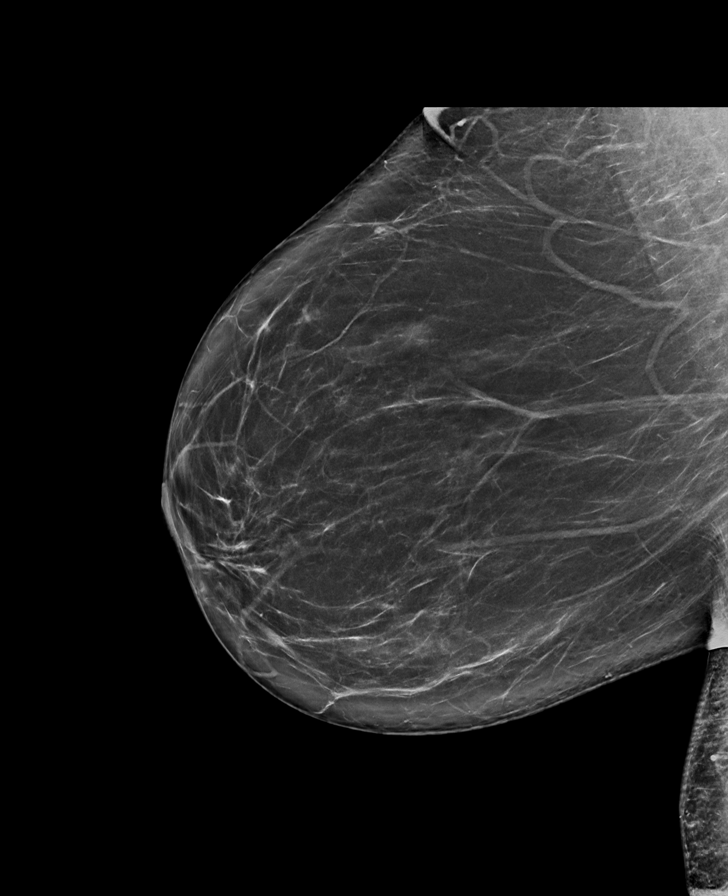

[R MLO tomo · tomo slice 45/90.0]
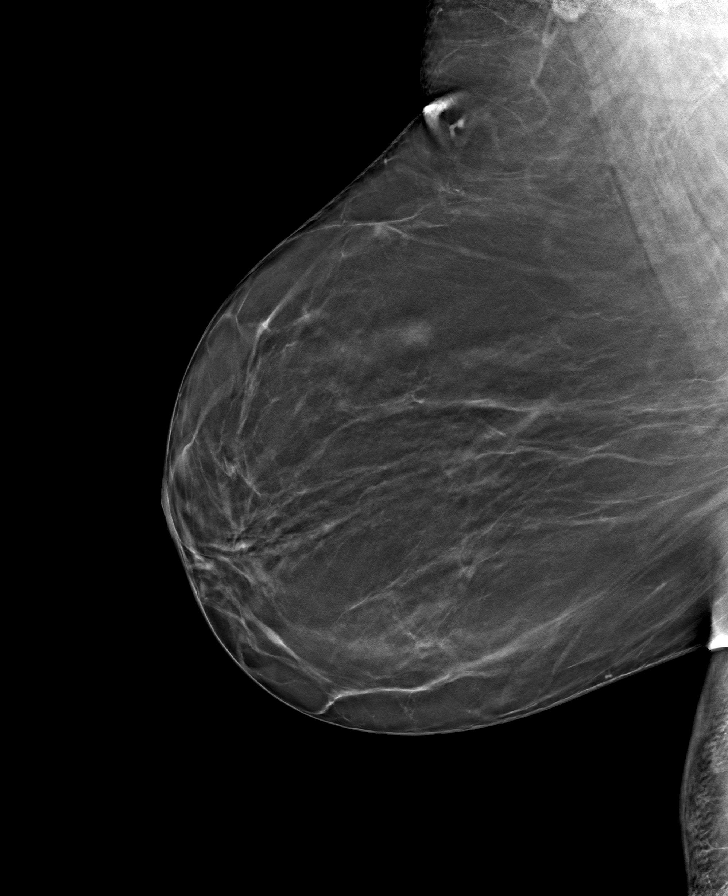

[L MLO tomo · tomo slice 47/92.0]
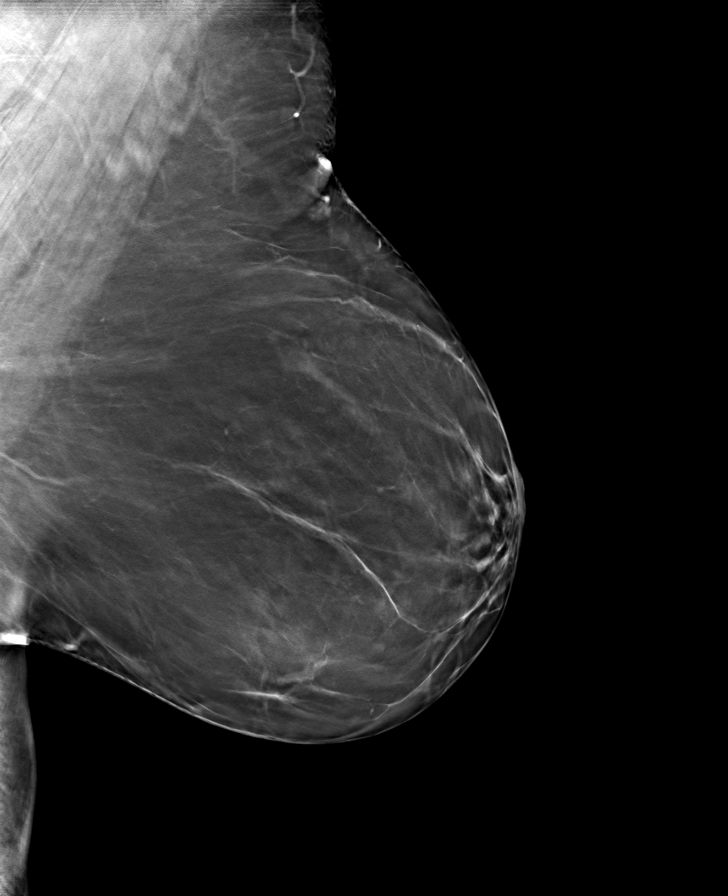

[R CC tomo · tomo slice 38/75.0]
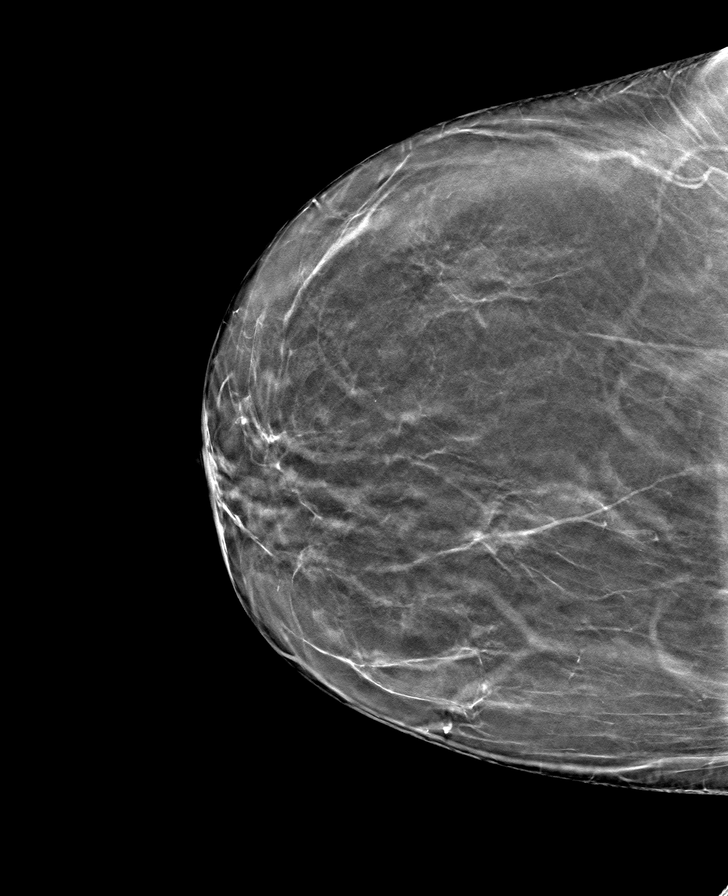

[L CC tomo · tomo slice 39/78.0]
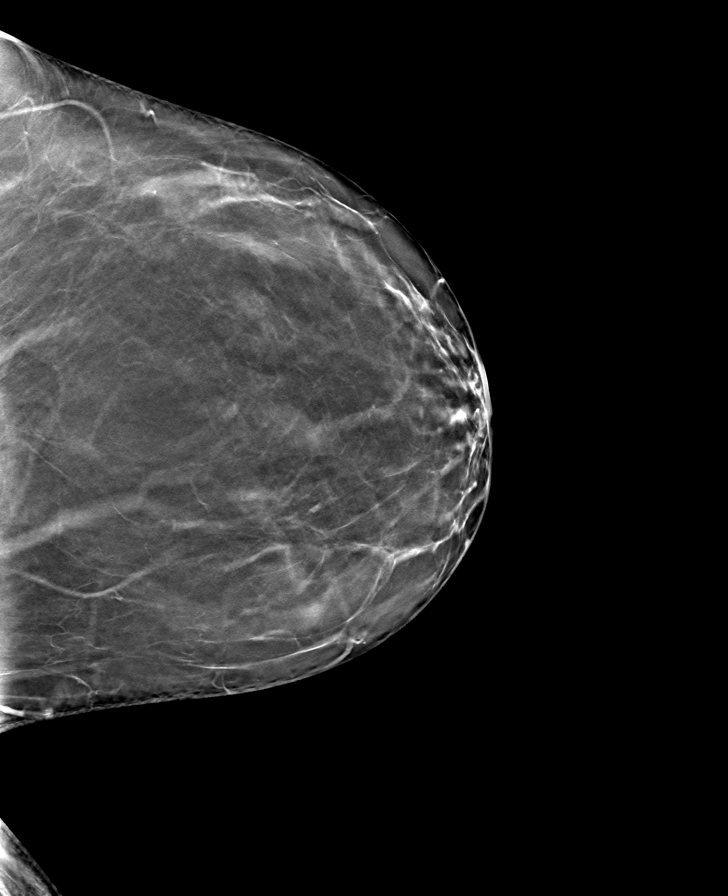

[8 of 24 positions shown; findings below may reference images not displayed]

ACR Breast Density Category b: There are scattered areas of
fibroglandular density.
FINDINGS: There are no findings suspicious for malignancy.
IMPRESSION: No mammographic evidence of malignancy. A result letter of this
screening mammogram will be mailed directly to the patient.

RECOMMENDATION:
Screening mammogram in one year. (Code:51-O-LD2)

BI-RADS CATEGORY  1: Negative.
